# Patient Record
Sex: Male | Born: 1955 | Race: White | Hispanic: No | Marital: Single | Smoking: Light tobacco smoker
Health system: Southern US, Community
[De-identification: ages and names within clinical notes are randomized; demographics above are authoritative.]

## PROBLEM LIST (undated history)

## (undated) DIAGNOSIS — E119 Type 2 diabetes mellitus without complications: Secondary | ICD-10-CM

## (undated) DIAGNOSIS — I714 Abdominal aortic aneurysm, without rupture, unspecified: Secondary | ICD-10-CM

## (undated) DIAGNOSIS — Z8719 Personal history of other diseases of the digestive system: Secondary | ICD-10-CM

## (undated) DIAGNOSIS — K219 Gastro-esophageal reflux disease without esophagitis: Secondary | ICD-10-CM

## (undated) DIAGNOSIS — I639 Cerebral infarction, unspecified: Secondary | ICD-10-CM

## (undated) HISTORY — DX: Cerebral infarction, unspecified: I63.9

## (undated) HISTORY — DX: Abdominal aortic aneurysm, without rupture: I71.4

## (undated) HISTORY — PX: HERNIA REPAIR: SHX51

## (undated) HISTORY — PX: CHOLECYSTECTOMY: SHX55

## (undated) HISTORY — DX: Abdominal aortic aneurysm, without rupture, unspecified: I71.40

## (undated) HISTORY — PX: APPENDECTOMY: SHX54

---

## 2004-04-23 DIAGNOSIS — I639 Cerebral infarction, unspecified: Secondary | ICD-10-CM

## 2004-04-23 HISTORY — DX: Cerebral infarction, unspecified: I63.9

## 2013-08-09 ENCOUNTER — Emergency Department (HOSPITAL_COMMUNITY): Payer: BC Managed Care – PPO

## 2013-08-09 ENCOUNTER — Inpatient Hospital Stay (HOSPITAL_COMMUNITY)
Admission: EM | Admit: 2013-08-09 | Discharge: 2013-08-19 | DRG: 237 | Disposition: A | Discharge status: Home / Self Care | Payer: BC Managed Care – PPO | Attending: Surgery | Admitting: Surgery

## 2013-08-09 ENCOUNTER — Encounter (HOSPITAL_COMMUNITY): Payer: Self-pay | Admitting: Emergency Medicine

## 2013-08-09 DIAGNOSIS — J9600 Acute respiratory failure, unspecified whether with hypoxia or hypercapnia: Secondary | ICD-10-CM | POA: Diagnosis not present

## 2013-08-09 DIAGNOSIS — K5900 Constipation, unspecified: Secondary | ICD-10-CM | POA: Diagnosis present

## 2013-08-09 DIAGNOSIS — F10931 Alcohol use, unspecified with withdrawal delirium: Secondary | ICD-10-CM | POA: Diagnosis not present

## 2013-08-09 DIAGNOSIS — J189 Pneumonia, unspecified organism: Secondary | ICD-10-CM | POA: Diagnosis not present

## 2013-08-09 DIAGNOSIS — J9601 Acute respiratory failure with hypoxia: Secondary | ICD-10-CM

## 2013-08-09 DIAGNOSIS — F411 Generalized anxiety disorder: Secondary | ICD-10-CM | POA: Diagnosis present

## 2013-08-09 DIAGNOSIS — I7102 Dissection of abdominal aorta: Secondary | ICD-10-CM

## 2013-08-09 DIAGNOSIS — I4891 Unspecified atrial fibrillation: Secondary | ICD-10-CM | POA: Diagnosis not present

## 2013-08-09 DIAGNOSIS — E7800 Pure hypercholesterolemia, unspecified: Secondary | ICD-10-CM | POA: Diagnosis present

## 2013-08-09 DIAGNOSIS — E876 Hypokalemia: Secondary | ICD-10-CM | POA: Diagnosis not present

## 2013-08-09 DIAGNOSIS — R404 Transient alteration of awareness: Secondary | ICD-10-CM | POA: Diagnosis not present

## 2013-08-09 DIAGNOSIS — R4182 Altered mental status, unspecified: Secondary | ICD-10-CM | POA: Diagnosis not present

## 2013-08-09 DIAGNOSIS — F10239 Alcohol dependence with withdrawal, unspecified: Secondary | ICD-10-CM

## 2013-08-09 DIAGNOSIS — I10 Essential (primary) hypertension: Secondary | ICD-10-CM | POA: Diagnosis present

## 2013-08-09 DIAGNOSIS — Z888 Allergy status to other drugs, medicaments and biological substances status: Secondary | ICD-10-CM

## 2013-08-09 DIAGNOSIS — F10231 Alcohol dependence with withdrawal delirium: Secondary | ICD-10-CM | POA: Diagnosis not present

## 2013-08-09 DIAGNOSIS — T368X5A Adverse effect of other systemic antibiotics, initial encounter: Secondary | ICD-10-CM | POA: Diagnosis not present

## 2013-08-09 DIAGNOSIS — G9340 Encephalopathy, unspecified: Secondary | ICD-10-CM

## 2013-08-09 DIAGNOSIS — L270 Generalized skin eruption due to drugs and medicaments taken internally: Secondary | ICD-10-CM | POA: Diagnosis not present

## 2013-08-09 DIAGNOSIS — D649 Anemia, unspecified: Secondary | ICD-10-CM | POA: Diagnosis not present

## 2013-08-09 DIAGNOSIS — E119 Type 2 diabetes mellitus without complications: Secondary | ICD-10-CM | POA: Diagnosis present

## 2013-08-09 DIAGNOSIS — F1020 Alcohol dependence, uncomplicated: Secondary | ICD-10-CM | POA: Diagnosis present

## 2013-08-09 DIAGNOSIS — K219 Gastro-esophageal reflux disease without esophagitis: Secondary | ICD-10-CM | POA: Diagnosis present

## 2013-08-09 DIAGNOSIS — F17200 Nicotine dependence, unspecified, uncomplicated: Secondary | ICD-10-CM | POA: Diagnosis present

## 2013-08-09 DIAGNOSIS — E873 Alkalosis: Secondary | ICD-10-CM | POA: Diagnosis not present

## 2013-08-09 DIAGNOSIS — Z880 Allergy status to penicillin: Secondary | ICD-10-CM

## 2013-08-09 DIAGNOSIS — F10939 Alcohol use, unspecified with withdrawal, unspecified: Secondary | ICD-10-CM

## 2013-08-09 DIAGNOSIS — I714 Abdominal aortic aneurysm, without rupture, unspecified: Principal | ICD-10-CM | POA: Diagnosis present

## 2013-08-09 DIAGNOSIS — Z8673 Personal history of transient ischemic attack (TIA), and cerebral infarction without residual deficits: Secondary | ICD-10-CM

## 2013-08-09 DIAGNOSIS — I7100 Dissection of unspecified site of aorta: Secondary | ICD-10-CM

## 2013-08-09 DIAGNOSIS — T360X5A Adverse effect of penicillins, initial encounter: Secondary | ICD-10-CM | POA: Diagnosis not present

## 2013-08-09 DIAGNOSIS — T465X5A Adverse effect of other antihypertensive drugs, initial encounter: Secondary | ICD-10-CM | POA: Diagnosis not present

## 2013-08-09 DIAGNOSIS — I509 Heart failure, unspecified: Secondary | ICD-10-CM | POA: Diagnosis not present

## 2013-08-09 DIAGNOSIS — A419 Sepsis, unspecified organism: Secondary | ICD-10-CM | POA: Diagnosis not present

## 2013-08-09 HISTORY — DX: Personal history of other diseases of the digestive system: Z87.19

## 2013-08-09 HISTORY — DX: Gastro-esophageal reflux disease without esophagitis: K21.9

## 2013-08-09 HISTORY — DX: Type 2 diabetes mellitus without complications: E11.9

## 2013-08-09 LAB — CBC WITH DIFFERENTIAL/PLATELET
BASOS PCT: 0 % (ref 0–1)
Basophils Absolute: 0 10*3/uL (ref 0.0–0.1)
Eosinophils Absolute: 0 10*3/uL (ref 0.0–0.7)
Eosinophils Relative: 0 % (ref 0–5)
HEMATOCRIT: 42.6 % (ref 39.0–52.0)
Hemoglobin: 15.2 g/dL (ref 13.0–17.0)
LYMPHS PCT: 17 % (ref 12–46)
Lymphs Abs: 2.1 10*3/uL (ref 0.7–4.0)
MCH: 33.3 pg (ref 26.0–34.0)
MCHC: 35.7 g/dL (ref 30.0–36.0)
MCV: 93.2 fL (ref 78.0–100.0)
MONO ABS: 1.3 10*3/uL — AB (ref 0.1–1.0)
Monocytes Relative: 11 % (ref 3–12)
NEUTROS ABS: 8.6 10*3/uL — AB (ref 1.7–7.7)
NEUTROS PCT: 72 % (ref 43–77)
Platelets: 304 10*3/uL (ref 150–400)
RBC: 4.57 MIL/uL (ref 4.22–5.81)
RDW: 11.7 % (ref 11.5–15.5)
WBC: 12 10*3/uL — AB (ref 4.0–10.5)

## 2013-08-09 LAB — COMPREHENSIVE METABOLIC PANEL
ALBUMIN: 3.6 g/dL (ref 3.5–5.2)
ALK PHOS: 103 U/L (ref 39–117)
ALT: 84 U/L — ABNORMAL HIGH (ref 0–53)
AST: 44 U/L — AB (ref 0–37)
BUN: 14 mg/dL (ref 6–23)
CHLORIDE: 97 meq/L (ref 96–112)
CO2: 23 meq/L (ref 19–32)
Calcium: 9.5 mg/dL (ref 8.4–10.5)
Creatinine, Ser: 1 mg/dL (ref 0.50–1.35)
GFR calc Af Amer: >90 mL/min (ref >90)
GFR, EST NON AFRICAN AMERICAN: 83 mL/min — AB (ref >90)
Glucose, Bld: 93 mg/dL (ref 70–99)
POTASSIUM: 3.9 meq/L (ref 3.7–5.3)
SODIUM: 136 meq/L — AB (ref 137–147)
Total Bilirubin: 0.5 mg/dL (ref 0.3–1.2)
Total Protein: 8 g/dL (ref 6.0–8.3)

## 2013-08-09 LAB — URINALYSIS, ROUTINE W REFLEX MICROSCOPIC
Bilirubin Urine: NEGATIVE
GLUCOSE, UA: NEGATIVE mg/dL
Hgb urine dipstick: NEGATIVE
Ketones, ur: NEGATIVE mg/dL
Leukocytes, UA: NEGATIVE
NITRITE: NEGATIVE
Protein, ur: NEGATIVE mg/dL
Specific Gravity, Urine: >1.046 — ABNORMAL HIGH (ref 1.005–1.030)
Urobilinogen, UA: 0.2 mg/dL (ref 0.0–1.0)
pH: 5.5 (ref 5.0–8.0)

## 2013-08-09 LAB — TYPE AND SCREEN
ABO/RH(D): O POS
ANTIBODY SCREEN: NEGATIVE

## 2013-08-09 LAB — LIPASE, BLOOD: Lipase: 26 U/L (ref 11–59)

## 2013-08-09 MED ORDER — ONDANSETRON HCL 4 MG/2ML IJ SOLN
4.0000 mg | Freq: Four times a day (QID) | INTRAMUSCULAR | Status: DC | PRN
  Administered 2013-08-09: 4 mg via INTRAVENOUS
  Filled 2013-08-09 (×2): qty 2

## 2013-08-09 MED ORDER — IOHEXOL 300 MG/ML  SOLN
25.0000 mL | Freq: Once | INTRAMUSCULAR | Status: AC | PRN
  Administered 2013-08-09: 25 mL via ORAL

## 2013-08-09 MED ORDER — POTASSIUM CHLORIDE CRYS ER 20 MEQ PO TBCR
20.0000 meq | EXTENDED_RELEASE_TABLET | Freq: Once | ORAL | Status: AC
  Administered 2013-08-09: 40 meq via ORAL
  Filled 2013-08-09: qty 2

## 2013-08-09 MED ORDER — SODIUM CHLORIDE 0.9 % IV SOLN
1000.0000 mL | INTRAVENOUS | Status: DC
  Administered 2013-08-09: 1000 mL via INTRAVENOUS

## 2013-08-09 MED ORDER — MORPHINE SULFATE 2 MG/ML IJ SOLN
2.0000 mg | INTRAMUSCULAR | Status: DC | PRN
  Administered 2013-08-09 – 2013-08-10 (×2): 4 mg via INTRAVENOUS
  Filled 2013-08-09 (×2): qty 2

## 2013-08-09 MED ORDER — OXYCODONE HCL 5 MG PO TABS
5.0000 mg | ORAL_TABLET | ORAL | Status: DC | PRN
  Filled 2013-08-09 (×2): qty 2

## 2013-08-09 MED ORDER — HYDROMORPHONE HCL PF 1 MG/ML IJ SOLN
1.0000 mg | INTRAMUSCULAR | Status: AC | PRN
  Administered 2013-08-09 – 2013-08-10 (×8): 1 mg via INTRAVENOUS
  Filled 2013-08-09 (×9): qty 1

## 2013-08-09 MED ORDER — SODIUM CHLORIDE 0.9 % IV SOLN
1000.0000 mL | Freq: Once | INTRAVENOUS | Status: AC
  Administered 2013-08-09: 1000 mL via INTRAVENOUS

## 2013-08-09 MED ORDER — IOHEXOL 300 MG/ML  SOLN
100.0000 mL | Freq: Once | INTRAMUSCULAR | Status: AC | PRN
  Administered 2013-08-09: 100 mL via INTRAVENOUS

## 2013-08-09 MED ORDER — ONDANSETRON HCL 4 MG/2ML IJ SOLN
4.0000 mg | Freq: Once | INTRAMUSCULAR | Status: AC
  Administered 2013-08-09: 4 mg via INTRAVENOUS
  Filled 2013-08-09: qty 2

## 2013-08-09 MED ORDER — PANTOPRAZOLE SODIUM 40 MG PO TBEC
40.0000 mg | DELAYED_RELEASE_TABLET | Freq: Every day | ORAL | Status: DC
  Administered 2013-08-09: 40 mg via ORAL
  Filled 2013-08-09: qty 1

## 2013-08-09 MED ORDER — HYDRALAZINE HCL 20 MG/ML IJ SOLN
10.0000 mg | INTRAMUSCULAR | Status: DC | PRN
Start: 2013-08-09 — End: 2013-08-10
  Administered 2013-08-09 – 2013-08-10 (×2): 10 mg via INTRAVENOUS
  Filled 2013-08-09 (×2): qty 1

## 2013-08-09 MED ORDER — METOPROLOL TARTRATE 1 MG/ML IV SOLN
2.0000 mg | INTRAVENOUS | Status: AC | PRN
  Administered 2013-08-10: 2 mg via INTRAVENOUS
  Administered 2013-08-10: 3 mg via INTRAVENOUS
  Filled 2013-08-09: qty 5

## 2013-08-09 MED ORDER — LORAZEPAM 2 MG/ML IJ SOLN
0.5000 mg | Freq: Once | INTRAMUSCULAR | Status: AC
  Administered 2013-08-09: 0.5 mg via INTRAVENOUS
  Filled 2013-08-09: qty 1

## 2013-08-09 MED ORDER — SODIUM CHLORIDE 0.9 % IV SOLN
INTRAVENOUS | Status: DC
  Administered 2013-08-10: 07:00:00 via INTRAVENOUS

## 2013-08-09 MED ORDER — PHENOL 1.4 % MT LIQD
1.0000 | OROMUCOSAL | Status: DC | PRN
  Filled 2013-08-09: qty 177

## 2013-08-09 MED ORDER — HYDROMORPHONE HCL PF 1 MG/ML IJ SOLN
1.0000 mg | INTRAMUSCULAR | Status: DC | PRN
  Administered 2013-08-09 (×2): 1 mg via INTRAVENOUS
  Filled 2013-08-09 (×2): qty 1

## 2013-08-09 MED ORDER — LABETALOL HCL 5 MG/ML IV SOLN
10.0000 mg | INTRAVENOUS | Status: DC | PRN
  Administered 2013-08-10 (×2): 10 mg via INTRAVENOUS
  Filled 2013-08-09 (×2): qty 4

## 2013-08-09 NOTE — ED Notes (Signed)
Pt given water after talking to the vascular surgeon.  Surgery  Will be performed tuesday

## 2013-08-09 NOTE — H&P (Signed)
History and Physical  Patient name: Walter Morgan MRN: [id number] DOB: 06-17-1955 Sex: male   Reason for Admission:  Chief Complaint  Patient presents with  . Abdominal Pain  . Constipation    HISTORY OF PRESENT ILLNESS: 58 year old presented to the ER tonight with approximately 1 week of abdominal and back pain.  He had been seen bby his PCP and treated with muscle relaxers and narcotics without relief.  HE reports no aggravating or relieving factors.  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  Patient reports subjective fevers at home.  He has been constipated.  Stool softners and suppositories have not worked.   He is a smoker.  He has a history of a stroke about 10 years ago secondary to lifestyle and stress.  He has no residual deficits.  He takes a statin for hypercholesterolemia.  He has early diabetes but is not on any medications.  Past Medical History  Diagnosis Date  . GERD (gastroesophageal reflux disease)   . Diabetes     possibly early stage, not formally diagnosed- MD wanted to start on metformin- is not taking per patient     Past Surgical History  Procedure Laterality Date  . Appendectomy    . Cholecystectomy    . Hernia repair      History   Social History  . Marital Status: Single    Spouse Name: N/A    Number of Children: N/A  . Years of Education: N/A   Occupational History  . Not on file.   Social History Main Topics  . Smoking status: Current Every Day Smoker -- 1.00 packs/day    Types: Cigarettes  . Smokeless tobacco: Not on file  . Alcohol Use: Yes     Comment: social  . Drug Use: No  . Sexual Activity: Not on file   Other Topics Concern  . Not on file   Social History Narrative  . No narrative on file    Family History: HTN  Allergies as of 08/09/2013 - Review Complete 08/09/2013  Allergen Reaction Noted  . Antihistamines, chlorpheniramine-type Anaphylaxis 08/09/2013  . Benzonatate Other (See  Comments) 08/09/2013  . Other Itching 08/09/2013  . Penicillins Nausea And Vomiting 08/09/2013    No current facility-administered medications on file prior to encounter.   No current outpatient prescriptions on file prior to encounter.     REVIEW OF SYSTEMS: Cardiovascular: No chest pain, chest pressure, palpitations, orthopnea, or dyspnea on exertion. No claudication or rest pain,  No history of DVT or phlebitis. Pulmonary: No productive cough, asthma or wheezing. Neurologic: No weakness, paresthesias, aphasia, or amaurosis. No dizziness. Hematologic: No bleeding problems or clotting disorders. Musculoskeletal: No joint pain or joint swelling. Gastrointestinal: constipation, abdominal pain Genitourinary: No dysuria or hematuria. Psychiatric:: No history of major depression. Integumentary: No rashes or ulcers. Constitutional: No fever or chills.  PHYSICAL EXAMINATION: General: The patient appears their stated age.  Vital signs are BP 105/54  Pulse 105  Temp(Src) 98.8 F (37.1 C) (Oral)  Resp 18  SpO2 92% HEENT:  No gross abnormalities Pulmonary: Respirations are non-labored Abdomen: Soft.  Tender on mid abdomen down to pubis Musculoskeletal: There are no major deformities.   Neurologic: No focal weakness or paresthesias are detected, Skin: There are no ulcer or rashes noted. Psychiatric: anxiety. Cardiovascular: There is a regular rate and rhythm without significant murmur appreciated.  Palpable pedal pulses and femoral pulses  Diagnostic  Studies: I have reviewed his CT with radiology: 1. 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 This could reflect sentinel  bleed, aortitis, or small infected aneurysm although the aneurysm is  not particularly saccular. The aneurysm at this level  measures only  approximately 3.3 x 2.8 cm     Assessment:  Symptomatic infrarenal PAU Plan: The patient will be admitted for pain control and medical optimization, including blood pressure control.  I believe this is a PAU, which has been causing him symptoms for approximately 1 week.  He will likely need surgical repair which I think can be performed with a bifurcated stent graft.  I discussed the details and risks/benefits of the procedure with the patient.  I will likely do this on Tuesday.  There is a concern that this may be a mycotic process, and therefore I will obtain blood cultures.  He will get a CXR and EKG as well.  I will monitor him in the step down unit.  If he has progression of his symptoms, I will treat him earlier.     Jorge NyV. Wells Brabham IV, M.D. [facility] Office: [phone] Pager:  [phone]

## 2013-08-09 NOTE — ED Notes (Signed)
Patient transported to X-ray 

## 2013-08-09 NOTE — Progress Notes (Signed)
Pt. Has severe pain in theabdomen  Rradiates to the back, bowel sound faint very distended and taut, pain is worse than before. Was given earlier Morphine 4mg . And now dilaudid 1mg . IV  Pt.s tachycardic, BP elevated Dr. Myra GianottiBrabham made aware with orders made. Will cont. To monitor pt.

## 2013-08-09 NOTE — ED Provider Notes (Signed)
5:48 PM I discussed the patient's CT scan with our radiologists. I then discussed the findings with the patient and his companion. VS 150/80, 95 - sr, nml  Patient has no new complaints, but does have concern over the dissection.  I discussed his case with our vascular team.  ECG : sinus tach, 102, t wave abnormalities, abnormal  7:39 PM Patient has similar VS, but remains concerned about his abdomen.  Patient takes Xanax at home and was provided .5mg  Ativan due to anxiety.   Walter Munchobert Kali Ambler, MD 08/09/13 [phone]

## 2013-08-09 NOTE — ED Notes (Signed)
MD at bedside. 

## 2013-08-09 NOTE — ED Notes (Signed)
Report called to rn on 2900

## 2013-08-09 NOTE — ED Notes (Signed)
Patient transported to CT 

## 2013-08-09 NOTE — ED Provider Notes (Addendum)
CSN: [id number]     Arrival date & time 08/09/13  1247 History   First MD Initiated Contact with Patient 08/09/13 1326     Chief Complaint  Patient presents with  . Abdominal Pain  . Constipation    HPI Comments: It started about a week ago.  He saw his PCP and told him about the back troubles and some lower abdominal pain.  Pt had a UA performed at the office that was reportedly normal.  He was diagnosed with a lumbar strain.  The abdominal pain has gotten worse.  Pt has not had a BM since Monday morning.    Patient is a 58 y.o. male presenting with abdominal pain and constipation. The history is provided by the patient.  Abdominal Pain Pain location:  LLQ and RLQ Pain quality: stabbing   Pain radiates to:  Does not radiate Pain severity:  Severe Associated symptoms: anorexia and constipation   Associated symptoms: no cough, no diarrhea, no dysuria, no fever and no nausea   Constipation Associated symptoms: abdominal pain and anorexia   Associated symptoms: no diarrhea, no dysuria, no fever and no nausea   He has tried laxatives without relief.  He does drink alcohol.  Sometimes 3-4 drinks per night, other times none.  No recent increase.  Past Medical History  Diagnosis Date  . GERD (gastroesophageal reflux disease)   . Diabetes     possibly early stage, not formally diagnosed but started on metformin   Past Surgical History  Procedure Laterality Date  . Appendectomy    . Cholecystectomy    . Hernia repair     History reviewed. No pertinent family history. History  Substance Use Topics  . Smoking status: Current Every Day Smoker -- 1.00 packs/day    Types: Cigarettes  . Smokeless tobacco: Not on file  . Alcohol Use: Yes     Comment: social    Review of Systems  Constitutional: Negative for fever.  Respiratory: Negative for cough.   Gastrointestinal: Positive for abdominal pain, constipation and anorexia. Negative for nausea and diarrhea.  Genitourinary: Negative for  dysuria.  All other systems reviewed and are negative.     Allergies  Antihistamines, chlorpheniramine-type; Benzonatate; Other; and Penicillins  Home Medications   Prior to Admission medications   Not on File   BP 152/106  Pulse 120  Temp(Src) 98.8 F (37.1 C) (Oral)  Resp 20  SpO2 97% Physical Exam  Nursing note and vitals reviewed. Constitutional: He appears well-developed and well-nourished. No distress.  HENT:  Head: Normocephalic and atraumatic.  Right Ear: External ear normal.  Left Ear: External ear normal.  Eyes: Conjunctivae are normal. Right eye exhibits no discharge. Left eye exhibits no discharge. No scleral icterus.  Neck: Neck supple. No tracheal deviation present.  Cardiovascular: Normal rate, regular rhythm and intact distal pulses.   Pulmonary/Chest: Effort normal and breath sounds normal. No stridor. No respiratory distress. He has no wheezes. He has no rales.  Abdominal: Soft. Bowel sounds are normal. He exhibits no distension. There is tenderness in the epigastric area. There is no rebound and no guarding. No hernia.  Musculoskeletal: He exhibits no edema and no tenderness.  Neurological: He is alert. He has normal strength. He displays tremor. No cranial nerve deficit (no facial droop, extraocular movements intact, no slurred speech) or sensory deficit. He exhibits normal muscle tone. He displays no seizure activity. Coordination normal.  Skin: Skin is warm and dry. No rash noted.  Psychiatric: He has a  normal mood and affect.    ED Course  Procedures (including critical care time) Labs Review Labs Reviewed  CBC WITH DIFFERENTIAL - Abnormal; Notable for the following:    WBC 12.0 (*)    Neutro Abs 8.6 (*)    Monocytes Absolute 1.3 (*)    All other components within normal limits  COMPREHENSIVE METABOLIC PANEL - Abnormal; Notable for the following:    Sodium 136 (*)    AST 44 (*)    ALT 84 (*)    GFR calc non Af Amer 83 (*)    All other  components within normal limits  LIPASE, BLOOD  URINALYSIS, ROUTINE W REFLEX MICROSCOPIC    Imaging Review Dg Abd Acute W/chest  08/09/2013   CLINICAL DATA:  Lower right and left quadrant pain.  Constipation.  EXAM: ACUTE ABDOMEN SERIES (ABDOMEN 2 VIEW & CHEST 1 VIEW)  COMPARISON:  None.  FINDINGS: Frontal view of the chest demonstrates midline trachea. Normal heart size and mediastinal contours. No pleural effusion or pneumothorax. Lower lobe predominant interstitial thickening. Patchy bibasilar opacities, favored to represent areas of scarring. Greater on the right than left.  Abdominal films demonstrate no free intraperitoneal air on upright positioning. No significant air fluid levels. Large amount of ascending colonic stool. No small bowel dilatation. Probable prostatic calcifications project just above the symphysis pubis. Cholecystectomy. No abnormal abdominal calcifications. No appendicolith.  IMPRESSION: No acute findings.  Possible constipation.  Peribronchial thickening which may relate to chronic bronchitis or smoking. Probable scarring at the lung bases.   Electronically Signed   By: Jeronimo GreavesKyle  Talbot M.D.   On: 08/09/2013 15:34   Medications  0.9 %  sodium chloride infusion (1,000 mLs Intravenous New Bag/Given 08/09/13 1415)    Followed by  0.9 %  sodium chloride infusion (not administered)  HYDROmorphone (DILAUDID) injection 1 mg (1 mg Intravenous Given 08/09/13 1427)  ondansetron (ZOFRAN) injection 4 mg (4 mg Intravenous Given 08/09/13 1427)     MDM   Pt has elevated WBC with abdominal pain.  Lipase normal.  Only slight elevation in LFTs.  AAS with possible consitpation but Will CT abdomen to evaluate further.    Celene KrasJon R Aerin Delany, MD 08/09/13 [phone]  Will turn over case to Dr Arlyn DunningLockwood  Tonjua Rossetti R Jennings Corado, MD 08/09/13 [phone]

## 2013-08-09 NOTE — ED Notes (Signed)
MD at bedside. Vascular Surgeon.

## 2013-08-09 NOTE — ED Notes (Signed)
i called to give report.  That room is not ready yet.  rn will call me back

## 2013-08-09 NOTE — ED Notes (Signed)
Pt presents to department for evaluation of lower abdominal pain, abdominal distention, and lower back pain. 10/10 pain upon arrival. No nausea/vomiting. Denies urinary symptoms. Pt is alert and oriented x4.

## 2013-08-09 NOTE — Progress Notes (Signed)
MEDICATION RELATED NOTE   Pharmacy Consult for renal dosing of antibiotics  Labs:  Recent Labs  08/09/13 1416  WBC 12.0*  HGB 15.2  HCT 42.6  PLT 304  CREATININE 1.00  ALBUMIN 3.6  PROT 8.0  AST 44*  ALT 84*  ALKPHOS 103  BILITOT 0.5   Medications:  Anti-infectives   None     Assessment: 58yo admitted with c/o abdominal/back pain x 1 week.  A CT was done which reveals some retroperitoneal stranding surrounding a focal dissection and possible intramural hematoma, atelectasis, and a small hiatal hernia with some mild esophagitis.  He has some mild leukocytosis, but is currently afebrile.  He has no antibiotics ordered at this time.  We have been asked to dose adjust for renal function should he need antibiotic coverage.  His creatinine is 1.0 and he has an estimated crcl > 3250ml/min.  Plan:  1.  No adjustments for renal function needed at this time. 2.  Please re-consult if you would like us to dose his antibiotics should he require them.  Nadara MustardNita Kandiss Ihrig, PharmD., MS Clinical Pharmacist Pager:  [phone] Thank you for allowing pharmacy to be part of this patients care team. 08/09/2013,9:04 PM

## 2013-08-10 ENCOUNTER — Encounter (HOSPITAL_COMMUNITY): Payer: Self-pay | Admitting: *Deleted

## 2013-08-10 ENCOUNTER — Inpatient Hospital Stay (HOSPITAL_COMMUNITY): Payer: BC Managed Care – PPO

## 2013-08-10 LAB — CBC
HCT: 39.1 % (ref 39.0–52.0)
Hemoglobin: 13.7 g/dL (ref 13.0–17.0)
MCH: 32.6 pg (ref 26.0–34.0)
MCHC: 35 g/dL (ref 30.0–36.0)
MCV: 93.1 fL (ref 78.0–100.0)
Platelets: 297 10*3/uL (ref 150–400)
RBC: 4.2 MIL/uL — ABNORMAL LOW (ref 4.22–5.81)
RDW: 11.8 % (ref 11.5–15.5)
WBC: 11.5 10*3/uL — ABNORMAL HIGH (ref 4.0–10.5)

## 2013-08-10 LAB — COMPREHENSIVE METABOLIC PANEL
ALBUMIN: 3.3 g/dL — AB (ref 3.5–5.2)
ALK PHOS: 129 U/L — AB (ref 39–117)
ALT: 80 U/L — AB (ref 0–53)
AST: 39 U/L — ABNORMAL HIGH (ref 0–37)
BUN: 13 mg/dL (ref 6–23)
CO2: 20 mEq/L (ref 19–32)
Calcium: 9 mg/dL (ref 8.4–10.5)
Chloride: 101 mEq/L (ref 96–112)
Creatinine, Ser: 0.85 mg/dL (ref 0.50–1.35)
GFR calc Af Amer: >90 mL/min (ref >90)
GFR calc non Af Amer: >90 mL/min (ref >90)
Glucose, Bld: 105 mg/dL — ABNORMAL HIGH (ref 70–99)
Potassium: 4.4 mEq/L (ref 3.7–5.3)
SODIUM: 136 meq/L — AB (ref 137–147)
TOTAL PROTEIN: 7.4 g/dL (ref 6.0–8.3)
Total Bilirubin: 0.5 mg/dL (ref 0.3–1.2)

## 2013-08-10 LAB — PROTIME-INR
INR: 1.03 (ref 0.00–1.49)
Prothrombin Time: 13.3 seconds (ref 11.6–15.2)

## 2013-08-10 LAB — ABO/RH: ABO/RH(D): O POS

## 2013-08-10 LAB — MRSA PCR SCREENING: MRSA BY PCR: NEGATIVE

## 2013-08-10 MED ORDER — PANTOPRAZOLE SODIUM 20 MG PO TBEC
20.0000 mg | DELAYED_RELEASE_TABLET | Freq: Every day | ORAL | Status: DC
  Administered 2013-08-10: 20 mg via ORAL
  Filled 2013-08-10 (×2): qty 1

## 2013-08-10 MED ORDER — CYCLOBENZAPRINE HCL 10 MG PO TABS
10.0000 mg | ORAL_TABLET | Freq: Three times a day (TID) | ORAL | Status: DC | PRN
  Administered 2013-08-16 – 2013-08-18 (×5): 10 mg via ORAL
  Filled 2013-08-10 (×5): qty 1

## 2013-08-10 MED ORDER — PROMETHAZINE HCL 25 MG/ML IJ SOLN
25.0000 mg | Freq: Once | INTRAMUSCULAR | Status: AC
  Administered 2013-08-10: 25 mg via INTRAVENOUS
  Filled 2013-08-10: qty 1

## 2013-08-10 MED ORDER — PANTOPRAZOLE SODIUM 20 MG PO TBEC: 20.0000 mg | DELAYED_RELEASE_TABLET | Freq: Every day | ORAL | Status: DC

## 2013-08-10 MED ORDER — ONDANSETRON HCL 4 MG/2ML IJ SOLN
4.0000 mg | Freq: Four times a day (QID) | INTRAMUSCULAR | Status: DC | PRN
  Administered 2013-08-10 – 2013-08-16 (×4): 4 mg via INTRAVENOUS
  Filled 2013-08-10 (×3): qty 2

## 2013-08-10 MED ORDER — ADULT MULTIVITAMIN W/MINERALS CH
1.0000 | ORAL_TABLET | Freq: Every day | ORAL | Status: DC
  Administered 2013-08-10 – 2013-08-19 (×7): 1 via ORAL
  Filled 2013-08-10 (×11): qty 1

## 2013-08-10 MED ORDER — HYDRALAZINE HCL 20 MG/ML IJ SOLN
10.0000 mg | INTRAMUSCULAR | Status: DC | PRN
  Administered 2013-08-11 – 2013-08-16 (×2): 10 mg via INTRAVENOUS
  Filled 2013-08-10 (×2): qty 1

## 2013-08-10 MED ORDER — HYDROMORPHONE HCL PF 1 MG/ML IJ SOLN
1.0000 mg | INTRAMUSCULAR | Status: AC | PRN
  Administered 2013-08-10 – 2013-08-11 (×7): 1 mg via INTRAVENOUS
  Filled 2013-08-10 (×7): qty 1

## 2013-08-10 MED ORDER — ALIGN 4 MG PO CAPS: 4.0000 mg | ORAL_CAPSULE | Freq: Every day | ORAL | Status: DC

## 2013-08-10 MED ORDER — VANCOMYCIN HCL 10 G IV SOLR
1500.0000 mg | INTRAVENOUS | Status: AC
  Administered 2013-08-11: 1500 mg via INTRAVENOUS
  Filled 2013-08-10: qty 1500

## 2013-08-10 MED ORDER — IBUPROFEN 800 MG PO TABS
800.0000 mg | ORAL_TABLET | Freq: Four times a day (QID) | ORAL | Status: DC
  Administered 2013-08-10 (×3): 800 mg via ORAL
  Filled 2013-08-10 (×13): qty 1

## 2013-08-10 MED ORDER — ASPIRIN 81 MG PO TABS: 81.0000 mg | ORAL_TABLET | Freq: Every day | ORAL | Status: DC

## 2013-08-10 MED ORDER — PANTOPRAZOLE SODIUM 40 MG IV SOLR
40.0000 mg | INTRAVENOUS | Status: DC
  Administered 2013-08-10 – 2013-08-12 (×3): 40 mg via INTRAVENOUS
  Filled 2013-08-10 (×5): qty 40

## 2013-08-10 MED ORDER — ASPIRIN EC 81 MG PO TBEC
81.0000 mg | DELAYED_RELEASE_TABLET | Freq: Every day | ORAL | Status: DC
  Administered 2013-08-10: 81 mg via ORAL
  Filled 2013-08-10 (×4): qty 1

## 2013-08-10 MED ORDER — NITROPRUSSIDE SODIUM 25 MG/ML IV SOLN
0.2500 ug/kg/min | INTRAVENOUS | Status: DC
  Administered 2013-08-10: 0.25 ug/kg/min via INTRAVENOUS
  Administered 2013-08-10: 1 ug/kg/min via INTRAVENOUS
  Administered 2013-08-10 – 2013-08-11 (×3): 2 ug/kg/min via INTRAVENOUS
  Administered 2013-08-11: 0.5 ug/kg/min via INTRAVENOUS
  Filled 2013-08-10 (×7): qty 2

## 2013-08-10 MED ORDER — LORAZEPAM 1 MG PO TABS
1.0000 mg | ORAL_TABLET | Freq: Two times a day (BID) | ORAL | Status: DC | PRN
  Administered 2013-08-10 – 2013-08-11 (×4): 1 mg via ORAL
  Filled 2013-08-10 (×4): qty 1

## 2013-08-10 MED ORDER — IOHEXOL 350 MG/ML SOLN
100.0000 mL | Freq: Once | INTRAVENOUS | Status: AC | PRN
  Administered 2013-08-10: 100 mL via INTRAVENOUS

## 2013-08-10 MED ORDER — MULTI-VITAMIN/MINERALS PO TABS: 1.0000 | ORAL_TABLET | Freq: Every day | ORAL | Status: DC

## 2013-08-10 MED ORDER — FLUTICASONE PROPIONATE 50 MCG/ACT NA SUSP
1.0000 | Freq: Every day | NASAL | Status: DC
  Filled 2013-08-10 (×2): qty 16

## 2013-08-10 MED ORDER — PNEUMOCOCCAL VAC POLYVALENT 25 MCG/0.5ML IJ INJ: 0.5000 mL | INJECTION | INTRAMUSCULAR | Status: DC | PRN

## 2013-08-10 MED ORDER — LABETALOL HCL 5 MG/ML IV SOLN
10.0000 mg | INTRAVENOUS | Status: DC | PRN
  Administered 2013-08-10 – 2013-08-12 (×4): 10 mg via INTRAVENOUS
  Filled 2013-08-10 (×5): qty 4

## 2013-08-10 MED ORDER — ATORVASTATIN CALCIUM 10 MG PO TABS
10.0000 mg | ORAL_TABLET | Freq: Every day | ORAL | Status: DC
  Administered 2013-08-12 – 2013-08-18 (×7): 10 mg via ORAL
  Filled 2013-08-10 (×10): qty 1

## 2013-08-10 NOTE — Progress Notes (Signed)
Pt. Had svere pain again just had a dose of morphine 15-20 minutes ago. Abdomen remains taut and distended. Dr. Myra GianottiBrabham was notified of the latest CBC results and pts. Status for pain with order to give dilaudid as ordered and to keep SBP <140. Will cont. To monitor.

## 2013-08-10 NOTE — Progress Notes (Signed)
    Subjective  - HD #1  Significant abd and back  pain overnight   Physical Exam:  abd soft Extremities warm Respirations non-labored       Assessment/Plan:  HD#1  I am concerned that he has had such significant back pain overnight.  I am going to repeat his CT scan this am to see if there has been any changes.  He will be moved to the ICU for better blood pressure control.  If there have been changes on his CT scan, I will operate later today, if not, he is scheduled for tomorow  Nada LibmanVance W Wladyslaw Henrichs 08/10/2013 7:50 AM --  Filed Vitals:   08/10/13 0734  BP: 164/106  Pulse: 119  Temp: 100.5 F (38.1 C)  Resp: 17    Intake/Output Summary (Last 24 hours) at 08/10/13 0750 Last data filed at 08/10/13 0724  Gross per 24 hour  Intake    900 ml  Output    825 ml  Net     75 ml     Laboratory CBC    Component Value Date/Time   WBC 11.5* 08/10/2013 0032   HGB 13.7 08/10/2013 0032   HCT 39.1 08/10/2013 0032   PLT 297 08/10/2013 0032    BMET    Component Value Date/Time   NA 136* 08/10/2013 0032   K 4.4 08/10/2013 0032   CL 101 08/10/2013 0032   CO2 20 08/10/2013 0032   GLUCOSE 105* 08/10/2013 0032   BUN 13 08/10/2013 0032   CREATININE 0.85 08/10/2013 0032   CALCIUM 9.0 08/10/2013 0032   GFRNONAA >90 08/10/2013 0032   GFRAA >90 08/10/2013 0032    COAG Lab Results  Component Value Date   INR 1.03 08/10/2013   No results found for this basename: PTT    Antibiotics Anti-infectives   None       V. Charlena CrossWells Mada Sadik IV, M.D. [facility] Office: [phone] Pager:  [phone]

## 2013-08-10 NOTE — Care Management Note (Addendum)
    Page 1 of 1   08/19/2013     1:32:11 PM CARE MANAGEMENT NOTE 08/19/2013  Patient:  Walter Morgan, Walter Morgan   Account Number:  [id number]  Date Initiated:  08/10/2013  Documentation initiated by:  Elissa Hefty  Subjective/Objective Assessment:   adm w  dissection aorta abd     Action/Plan:   lives w fam, pcp dr Blenda Mounts   DC Planning Services  CM consult      [place]   Choice offered to / List presented to:  C-5 Sibling      HH arranged  [place].   Status of service:  Completed, signed off  Discharge Disposition:  [place]  Per UR Regulation:  Reviewed for med. necessity/level of care/duration of stay  Comments:  08/19/13 1321 [place] RN MSN BSN CCM Pt has decided he does need home therapy, has chosen [facility].  PT also recommends rolling walker but pt states he does not want walker, will obtain one later if needed.  08/17/13 1050 [place] MSN BSN CCM Met with pt and 2 sisters in room.  They report that pt lives alone and will need assistance after discharge. Sisters live in [place] but will stay with pt as long as needed.  One sister has reservations for a cruise but requests a letter stating she will be caregiver for pt s/p surgery so that her trip insurance will reimburse her reservation fee.  CM will compose letter for surgeon's signature.  PT recommends home health PT but pt wants to wait until he is closer to discharge to decide if he needs same.  Provided list of home health agencies.

## 2013-08-10 NOTE — Progress Notes (Signed)
Dr. Myra GianottiBrabham called in regards to patient's SBP and HR. Labetolol and Hydralize re-ordered PRN. Patient current SBP 121 with HR of 112, nipride drip at 2-mcg. Respiratory called due to patient O2 saturations. He is falling asleep and dropping into mid 80's. Venti masked placed at 6L. Patient doesn't expressed any discomfort with breathing. Currently resting comfortably. 2 Dilaudid administrations have been given for abd pain. Franki CabotBrianna Aleyssa Pike, RN

## 2013-08-11 ENCOUNTER — Inpatient Hospital Stay (HOSPITAL_COMMUNITY): Payer: BC Managed Care – PPO

## 2013-08-11 ENCOUNTER — Encounter (HOSPITAL_COMMUNITY): Payer: BC Managed Care – PPO | Admitting: Anesthesiology

## 2013-08-11 ENCOUNTER — Encounter (HOSPITAL_COMMUNITY)
Admission: EM | Disposition: A | Discharge status: Home / Self Care | Payer: Self-pay | Source: Home / Self Care | Attending: Surgery

## 2013-08-11 ENCOUNTER — Encounter (HOSPITAL_COMMUNITY): Payer: Self-pay | Admitting: Anesthesiology

## 2013-08-11 ENCOUNTER — Inpatient Hospital Stay (HOSPITAL_COMMUNITY): Payer: BC Managed Care – PPO | Admitting: Anesthesiology

## 2013-08-11 DIAGNOSIS — F10239 Alcohol dependence with withdrawal, unspecified: Secondary | ICD-10-CM

## 2013-08-11 DIAGNOSIS — I714 Abdominal aortic aneurysm, without rupture, unspecified: Secondary | ICD-10-CM | POA: Diagnosis present

## 2013-08-11 DIAGNOSIS — I7100 Dissection of unspecified site of aorta: Secondary | ICD-10-CM

## 2013-08-11 DIAGNOSIS — F10939 Alcohol use, unspecified with withdrawal, unspecified: Secondary | ICD-10-CM

## 2013-08-11 DIAGNOSIS — G9340 Encephalopathy, unspecified: Secondary | ICD-10-CM

## 2013-08-11 HISTORY — PX: ABDOMINAL AORTIC ENDOVASCULAR STENT GRAFT: SHX5707

## 2013-08-11 LAB — CBC
HCT: 32.4 % — ABNORMAL LOW (ref 39.0–52.0)
HEMATOCRIT: 35.4 % — AB (ref 39.0–52.0)
Hemoglobin: 11.9 g/dL — ABNORMAL LOW (ref 13.0–17.0)
Hemoglobin: 10.9 g/dL — ABNORMAL LOW (ref 13.0–17.0)
MCH: 31.9 pg (ref 26.0–34.0)
MCH: 31.7 pg (ref 26.0–34.0)
MCHC: 33.6 g/dL (ref 30.0–36.0)
MCHC: 33.6 g/dL (ref 30.0–36.0)
MCV: 94.9 fL (ref 78.0–100.0)
MCV: 94.2 fL (ref 78.0–100.0)
PLATELETS: 250 10*3/uL (ref 150–400)
Platelets: 268 10*3/uL (ref 150–400)
RBC: 3.73 MIL/uL — AB (ref 4.22–5.81)
RBC: 3.44 MIL/uL — AB (ref 4.22–5.81)
RDW: 11.9 % (ref 11.5–15.5)
RDW: 11.9 % (ref 11.5–15.5)
WBC: 10.6 10*3/uL — AB (ref 4.0–10.5)
WBC: 9.3 10*3/uL (ref 4.0–10.5)

## 2013-08-11 LAB — POCT I-STAT 3, ART BLOOD GAS (G3+)
ACID-BASE DEFICIT: 4 mmol/L — AB (ref 0.0–2.0)
BICARBONATE: 19.4 meq/L — AB (ref 20.0–24.0)
O2 Saturation: 89 %
PO2 ART: 54 mmHg — AB (ref 80.0–100.0)
TCO2: 20 mmol/L (ref 0–100)
pCO2 arterial: 29 mmHg — ABNORMAL LOW (ref 35.0–45.0)
pH, Arterial: 7.434 (ref 7.350–7.450)

## 2013-08-11 LAB — BASIC METABOLIC PANEL
BUN: 22 mg/dL (ref 6–23)
BUN: 18 mg/dL (ref 6–23)
CALCIUM: 8.6 mg/dL (ref 8.4–10.5)
CHLORIDE: 103 meq/L (ref 96–112)
CO2: 24 meq/L (ref 19–32)
CO2: 20 meq/L (ref 19–32)
Calcium: 9.1 mg/dL (ref 8.4–10.5)
Chloride: 101 mEq/L (ref 96–112)
Creatinine, Ser: 1 mg/dL (ref 0.50–1.35)
Creatinine, Ser: 0.92 mg/dL (ref 0.50–1.35)
GFR calc Af Amer: >90 mL/min (ref >90)
GFR calc Af Amer: >90 mL/min (ref >90)
GFR calc non Af Amer: 83 mL/min — ABNORMAL LOW (ref >90)
Glucose, Bld: 114 mg/dL — ABNORMAL HIGH (ref 70–99)
Glucose, Bld: 150 mg/dL — ABNORMAL HIGH (ref 70–99)
POTASSIUM: 4.5 meq/L (ref 3.7–5.3)
Potassium: 4.4 mEq/L (ref 3.7–5.3)
SODIUM: 138 meq/L (ref 137–147)
Sodium: 135 mEq/L — ABNORMAL LOW (ref 137–147)

## 2013-08-11 LAB — MAGNESIUM: Magnesium: 1.9 mg/dL (ref 1.5–2.5)

## 2013-08-11 LAB — CARBOXYHEMOGLOBIN
CARBOXYHEMOGLOBIN: 1.3 % (ref 0.5–1.5)
METHEMOGLOBIN: 1.1 % (ref 0.0–1.5)
O2 SAT: 31.5 %
Total hemoglobin: 10.2 g/dL — ABNORMAL LOW (ref 13.5–18.0)

## 2013-08-11 LAB — PROTIME-INR
INR: 1.02 (ref 0.00–1.49)
PROTHROMBIN TIME: 13.2 s (ref 11.6–15.2)

## 2013-08-11 LAB — APTT: aPTT: 32 seconds (ref 24–37)

## 2013-08-11 LAB — GLUCOSE, CAPILLARY: Glucose-Capillary: 117 mg/dL — ABNORMAL HIGH (ref 70–99)

## 2013-08-11 SURGERY — INSERTION, ENDOVASCULAR STENT GRAFT, AORTA, ABDOMINAL
Anesthesia: General

## 2013-08-11 MED ORDER — ESMOLOL HCL 10 MG/ML IV SOLN
INTRAVENOUS | Status: DC | PRN
  Administered 2013-08-11 (×2): 30 mg via INTRAVENOUS

## 2013-08-11 MED ORDER — FOLIC ACID 5 MG/ML IJ SOLN
1.0000 mg | Freq: Every day | INTRAMUSCULAR | Status: DC
  Administered 2013-08-11 – 2013-08-15 (×3): 1 mg via INTRAVENOUS
  Filled 2013-08-11 (×9): qty 0.2

## 2013-08-11 MED ORDER — FENTANYL CITRATE 0.05 MG/ML IJ SOLN
INTRAMUSCULAR | Status: DC | PRN
  Administered 2013-08-11 (×3): 50 ug via INTRAVENOUS
  Administered 2013-08-11: 100 ug via INTRAVENOUS
  Administered 2013-08-11: 50 ug via INTRAVENOUS
  Administered 2013-08-11 (×2): 100 ug via INTRAVENOUS

## 2013-08-11 MED ORDER — FENTANYL CITRATE 0.05 MG/ML IJ SOLN
INTRAMUSCULAR | Status: AC
  Filled 2013-08-11: qty 5

## 2013-08-11 MED ORDER — ESMOLOL HCL-SODIUM CHLORIDE 2000 MG/100ML IV SOLN
25.0000 ug/kg/min | INTRAVENOUS | Status: DC
  Administered 2013-08-12 (×3): 175 ug/kg/min via INTRAVENOUS
  Administered 2013-08-12: 125 ug/kg/min via INTRAVENOUS
  Administered 2013-08-12 (×3): 175 ug/kg/min via INTRAVENOUS
  Administered 2013-08-12: 25 ug/kg/min via INTRAVENOUS
  Administered 2013-08-12 (×2): 200 ug/kg/min via INTRAVENOUS
  Administered 2013-08-12 – 2013-08-13 (×3): 175 ug/kg/min via INTRAVENOUS
  Administered 2013-08-13: 150 ug/kg/min via INTRAVENOUS
  Administered 2013-08-13: 50 ug/kg/min via INTRAVENOUS
  Administered 2013-08-13: 90 ug/kg/min via INTRAVENOUS
  Administered 2013-08-13: 175 ug/kg/min via INTRAVENOUS
  Administered 2013-08-13: 170 ug/kg/min via INTRAVENOUS
  Administered 2013-08-13: 55 ug/kg/min via INTRAVENOUS
  Administered 2013-08-14: 100 ug/kg/min via INTRAVENOUS
  Administered 2013-08-14: 50 ug/kg/min via INTRAVENOUS
  Administered 2013-08-14: 100 ug/kg/min via INTRAVENOUS
  Filled 2013-08-11 (×27): qty 100

## 2013-08-11 MED ORDER — SODIUM CHLORIDE 0.9 % IJ SOLN
INTRAMUSCULAR | Status: AC
  Filled 2013-08-11: qty 10

## 2013-08-11 MED ORDER — VITAMIN B-1 100 MG PO TABS
100.0000 mg | ORAL_TABLET | Freq: Every day | ORAL | Status: DC
  Administered 2013-08-13 – 2013-08-19 (×6): 100 mg via ORAL
  Filled 2013-08-11 (×9): qty 1

## 2013-08-11 MED ORDER — PROPOFOL 10 MG/ML IV BOLUS
INTRAVENOUS | Status: AC
  Filled 2013-08-11: qty 20

## 2013-08-11 MED ORDER — POTASSIUM CHLORIDE CRYS ER 20 MEQ PO TBCR: 20.0000 meq | EXTENDED_RELEASE_TABLET | Freq: Every day | ORAL | Status: DC | PRN

## 2013-08-11 MED ORDER — HALOPERIDOL LACTATE 5 MG/ML IJ SOLN
5.0000 mg | Freq: Once | INTRAMUSCULAR | Status: AC
  Administered 2013-08-11: 5 mg via INTRAVENOUS

## 2013-08-11 MED ORDER — MAGNESIUM SULFATE 40 MG/ML IJ SOLN
2.0000 g | Freq: Every day | INTRAMUSCULAR | Status: DC | PRN
  Filled 2013-08-11: qty 50

## 2013-08-11 MED ORDER — LACTATED RINGERS IV SOLN
INTRAVENOUS | Status: DC | PRN
  Administered 2013-08-11: 13:00:00 via INTRAVENOUS

## 2013-08-11 MED ORDER — NEOSTIGMINE METHYLSULFATE 1 MG/ML IJ SOLN
INTRAMUSCULAR | Status: DC | PRN
  Administered 2013-08-11: 5 mg via INTRAVENOUS

## 2013-08-11 MED ORDER — HEPARIN SODIUM (PORCINE) 1000 UNIT/ML IJ SOLN
INTRAMUSCULAR | Status: AC
  Filled 2013-08-11: qty 1

## 2013-08-11 MED ORDER — VECURONIUM BROMIDE 10 MG IV SOLR
INTRAVENOUS | Status: AC
  Filled 2013-08-11: qty 10

## 2013-08-11 MED ORDER — 0.9 % SODIUM CHLORIDE (POUR BTL) OPTIME
TOPICAL | Status: DC | PRN
  Administered 2013-08-11: 1000 mL

## 2013-08-11 MED ORDER — PROPOFOL 10 MG/ML IV BOLUS
INTRAVENOUS | Status: DC | PRN
  Administered 2013-08-11: 100 mg via INTRAVENOUS

## 2013-08-11 MED ORDER — DEXMEDETOMIDINE BOLUS VIA INFUSION
1.0000 ug/kg | INTRAVENOUS | Status: DC
  Filled 2013-08-11: qty 93

## 2013-08-11 MED ORDER — OXYCODONE HCL 5 MG PO TABS: 5.0000 mg | ORAL_TABLET | Freq: Once | ORAL | Status: DC | PRN

## 2013-08-11 MED ORDER — PHENOL 1.4 % MT LIQD: 1.0000 | OROMUCOSAL | Status: DC | PRN

## 2013-08-11 MED ORDER — LORAZEPAM 2 MG/ML IJ SOLN
INTRAMUSCULAR | Status: AC
  Filled 2013-08-11: qty 1

## 2013-08-11 MED ORDER — NITROPRUSSIDE SODIUM 25 MG/ML IV SOLN
50000.0000 ug | INTRAVENOUS | Status: DC | PRN
  Administered 2013-08-11: .75 ug/kg/min via INTRAVENOUS

## 2013-08-11 MED ORDER — MIDAZOLAM HCL 2 MG/2ML IJ SOLN
INTRAMUSCULAR | Status: AC
  Filled 2013-08-11: qty 2

## 2013-08-11 MED ORDER — ACETAMINOPHEN 650 MG RE SUPP
325.0000 mg | RECTAL | Status: DC | PRN
  Administered 2013-08-12 – 2013-08-13 (×3): 650 mg via RECTAL
  Filled 2013-08-11 (×3): qty 1

## 2013-08-11 MED ORDER — HALOPERIDOL LACTATE 5 MG/ML IJ SOLN
INTRAMUSCULAR | Status: AC
  Filled 2013-08-11: qty 1

## 2013-08-11 MED ORDER — DEXMEDETOMIDINE HCL IN NACL 400 MCG/100ML IV SOLN
0.4000 ug/kg/h | INTRAVENOUS | Status: DC
  Administered 2013-08-11: 1.1 ug/kg/h via INTRAVENOUS
  Administered 2013-08-11: 1.2 ug/kg/h via INTRAVENOUS
  Filled 2013-08-11 (×2): qty 50

## 2013-08-11 MED ORDER — FOLIC ACID 1 MG PO TABS
1.0000 mg | ORAL_TABLET | Freq: Every day | ORAL | Status: DC
  Filled 2013-08-11: qty 1

## 2013-08-11 MED ORDER — ADULT MULTIVITAMIN W/MINERALS CH: 1.0000 | ORAL_TABLET | Freq: Every day | ORAL | Status: DC

## 2013-08-11 MED ORDER — HEPARIN SODIUM (PORCINE) 1000 UNIT/ML IJ SOLN
INTRAMUSCULAR | Status: DC | PRN
  Administered 2013-08-11: 10000 [IU] via INTRAVENOUS

## 2013-08-11 MED ORDER — VITAMIN B-1 100 MG PO TABS
100.0000 mg | ORAL_TABLET | Freq: Every day | ORAL | Status: DC
  Filled 2013-08-11: qty 1

## 2013-08-11 MED ORDER — HYDROMORPHONE HCL PF 1 MG/ML IJ SOLN
INTRAMUSCULAR | Status: AC
  Filled 2013-08-11: qty 1

## 2013-08-11 MED ORDER — LABETALOL HCL 5 MG/ML IV SOLN
INTRAVENOUS | Status: DC | PRN
  Administered 2013-08-11 (×4): 10 mg via INTRAVENOUS

## 2013-08-11 MED ORDER — ONDANSETRON HCL 4 MG/2ML IJ SOLN
INTRAMUSCULAR | Status: AC
  Filled 2013-08-11: qty 2

## 2013-08-11 MED ORDER — SODIUM CHLORIDE 0.9 % IV SOLN
INTRAVENOUS | Status: DC
  Administered 2013-08-12: 03:00:00 via INTRAVENOUS
  Administered 2013-08-12: 125 mL/h via INTRAVENOUS
  Administered 2013-08-13: 125 mL via INTRAVENOUS
  Administered 2013-08-15: 17:00:00 via INTRAVENOUS

## 2013-08-11 MED ORDER — SODIUM CHLORIDE 0.9 % IR SOLN
Status: DC | PRN
  Administered 2013-08-11: 14:00:00

## 2013-08-11 MED ORDER — OXYCODONE-ACETAMINOPHEN 5-325 MG PO TABS: 1.0000 | ORAL_TABLET | ORAL | Status: DC | PRN

## 2013-08-11 MED ORDER — ONDANSETRON HCL 4 MG/2ML IJ SOLN: 4.0000 mg | Freq: Once | INTRAMUSCULAR | Status: DC | PRN

## 2013-08-11 MED ORDER — HYDROMORPHONE HCL PF 1 MG/ML IJ SOLN: 1.0000 mg | INTRAMUSCULAR | Status: DC | PRN

## 2013-08-11 MED ORDER — LIDOCAINE HCL (CARDIAC) 20 MG/ML IV SOLN
INTRAVENOUS | Status: AC
  Filled 2013-08-11: qty 5

## 2013-08-11 MED ORDER — HYDRALAZINE HCL 20 MG/ML IJ SOLN
INTRAMUSCULAR | Status: AC
Start: 2013-08-11 — End: 2013-08-12
  Filled 2013-08-11: qty 1

## 2013-08-11 MED ORDER — VECURONIUM BROMIDE 10 MG IV SOLR
INTRAVENOUS | Status: DC | PRN
  Administered 2013-08-11: 2 mg via INTRAVENOUS
  Administered 2013-08-11: 1 mg via INTRAVENOUS

## 2013-08-11 MED ORDER — HALOPERIDOL LACTATE 5 MG/ML IJ SOLN
1.0000 mg | INTRAMUSCULAR | Status: DC | PRN
  Administered 2013-08-11: 4 mg via INTRAVENOUS
  Filled 2013-08-11: qty 1

## 2013-08-11 MED ORDER — OXYCODONE HCL 5 MG/5ML PO SOLN: 5.0000 mg | Freq: Once | ORAL | Status: DC | PRN

## 2013-08-11 MED ORDER — LORAZEPAM 2 MG/ML IJ SOLN: 1.0000 mg | Freq: Once | INTRAMUSCULAR | Status: DC

## 2013-08-11 MED ORDER — VANCOMYCIN HCL IN DEXTROSE 1-5 GM/200ML-% IV SOLN
1000.0000 mg | Freq: Two times a day (BID) | INTRAVENOUS | Status: AC
  Administered 2013-08-11 – 2013-08-12 (×2): 1000 mg via INTRAVENOUS
  Filled 2013-08-11 (×2): qty 200

## 2013-08-11 MED ORDER — MIDAZOLAM HCL 5 MG/5ML IJ SOLN
INTRAMUSCULAR | Status: DC | PRN
  Administered 2013-08-11 (×2): 1 mg via INTRAVENOUS

## 2013-08-11 MED ORDER — LACTATED RINGERS IV SOLN
INTRAVENOUS | Status: DC
  Administered 2013-08-11 (×2): via INTRAVENOUS

## 2013-08-11 MED ORDER — IODIXANOL 320 MG/ML IV SOLN
INTRAVENOUS | Status: DC | PRN
  Administered 2013-08-11: 59.06 mL via INTRA_ARTERIAL

## 2013-08-11 MED ORDER — METOPROLOL TARTRATE 1 MG/ML IV SOLN
INTRAVENOUS | Status: AC
  Filled 2013-08-11: qty 5

## 2013-08-11 MED ORDER — LORAZEPAM 2 MG/ML IJ SOLN
INTRAMUSCULAR | Status: AC
Start: 2013-08-11 — End: 2013-08-12
  Filled 2013-08-11: qty 1

## 2013-08-11 MED ORDER — FOLIC ACID 1 MG PO TABS
1.0000 mg | ORAL_TABLET | Freq: Every day | ORAL | Status: DC
  Administered 2013-08-13 – 2013-08-19 (×6): 1 mg via ORAL
  Filled 2013-08-11 (×9): qty 1

## 2013-08-11 MED ORDER — GLYCOPYRROLATE 0.2 MG/ML IJ SOLN
INTRAMUSCULAR | Status: DC | PRN
  Administered 2013-08-11: 0.4 mg via INTRAVENOUS

## 2013-08-11 MED ORDER — LORAZEPAM 2 MG/ML IJ SOLN
1.0000 mg | INTRAMUSCULAR | Status: DC | PRN
  Administered 2013-08-11 – 2013-08-12 (×4): 2 mg via INTRAVENOUS
  Filled 2013-08-11 (×4): qty 1

## 2013-08-11 MED ORDER — LEVOFLOXACIN IN D5W 500 MG/100ML IV SOLN
500.0000 mg | INTRAVENOUS | Status: AC
  Administered 2013-08-11: 500 mg via INTRAVENOUS
  Filled 2013-08-11: qty 100

## 2013-08-11 MED ORDER — DEXMEDETOMIDINE HCL IN NACL 400 MCG/100ML IV SOLN
0.2000 ug/kg/h | INTRAVENOUS | Status: DC
  Administered 2013-08-11: 1.7 ug/kg/h via INTRAVENOUS
  Administered 2013-08-11: 1.2 ug/kg/h via INTRAVENOUS
  Administered 2013-08-11 – 2013-08-12 (×2): 2 ug/kg/h via INTRAVENOUS
  Administered 2013-08-12: 1.3 ug/kg/h via INTRAVENOUS
  Administered 2013-08-12: 2 ug/kg/h via INTRAVENOUS
  Administered 2013-08-12: 1.5 ug/kg/h via INTRAVENOUS
  Administered 2013-08-12: 1.4 ug/kg/h via INTRAVENOUS
  Filled 2013-08-11 (×5): qty 100
  Filled 2013-08-11: qty 50
  Filled 2013-08-11: qty 100
  Filled 2013-08-11: qty 50

## 2013-08-11 MED ORDER — LORAZEPAM 2 MG/ML IJ SOLN: 1.0000 mg | INTRAMUSCULAR | Status: DC | PRN

## 2013-08-11 MED ORDER — MEPERIDINE HCL 25 MG/ML IJ SOLN
6.2500 mg | INTRAMUSCULAR | Status: DC | PRN
Start: 2013-08-11 — End: 2013-08-11

## 2013-08-11 MED ORDER — LORAZEPAM 2 MG/ML IJ SOLN
INTRAMUSCULAR | Status: AC
  Administered 2013-08-11: 2 mg via INTRAVENOUS
  Filled 2013-08-11: qty 1

## 2013-08-11 MED ORDER — ROCURONIUM BROMIDE 50 MG/5ML IV SOLN
INTRAVENOUS | Status: AC
  Filled 2013-08-11: qty 1

## 2013-08-11 MED ORDER — NEOSTIGMINE METHYLSULFATE 1 MG/ML IJ SOLN
INTRAMUSCULAR | Status: AC
  Filled 2013-08-11: qty 10

## 2013-08-11 MED ORDER — HYDROMORPHONE HCL PF 1 MG/ML IJ SOLN
0.2500 mg | INTRAMUSCULAR | Status: DC | PRN
Start: 2013-08-11 — End: 2013-08-11
  Administered 2013-08-11: 1 mg via INTRAVENOUS

## 2013-08-11 MED ORDER — DOCUSATE SODIUM 100 MG PO CAPS: 100.0000 mg | ORAL_CAPSULE | Freq: Every day | ORAL | Status: DC

## 2013-08-11 MED ORDER — ROCURONIUM BROMIDE 100 MG/10ML IV SOLN
INTRAVENOUS | Status: DC | PRN
  Administered 2013-08-11: 50 mg via INTRAVENOUS

## 2013-08-11 MED ORDER — ACETAMINOPHEN 325 MG PO TABS: 325.0000 mg | ORAL_TABLET | ORAL | Status: DC | PRN

## 2013-08-11 MED ORDER — LORAZEPAM 2 MG/ML IJ SOLN
1.0000 mg | INTRAMUSCULAR | Status: DC | PRN
  Administered 2013-08-11 (×2): 1 mg via INTRAVENOUS
  Filled 2013-08-11: qty 1

## 2013-08-11 MED ORDER — GLYCOPYRROLATE 0.2 MG/ML IJ SOLN
INTRAMUSCULAR | Status: AC
  Filled 2013-08-11: qty 2

## 2013-08-11 MED ORDER — MIDAZOLAM HCL 2 MG/2ML IJ SOLN
1.0000 mg | INTRAMUSCULAR | Status: AC
  Administered 2013-08-11: 1 mg via INTRAVENOUS

## 2013-08-11 MED ORDER — SODIUM CHLORIDE 0.9 % IV SOLN: 500.0000 mL | Freq: Once | INTRAVENOUS | Status: AC | PRN

## 2013-08-11 MED ORDER — THIAMINE HCL 100 MG/ML IJ SOLN
100.0000 mg | Freq: Every day | INTRAMUSCULAR | Status: DC
  Administered 2013-08-11 – 2013-08-15 (×3): 100 mg via INTRAVENOUS
  Filled 2013-08-11 (×9): qty 1

## 2013-08-11 MED ORDER — ARTIFICIAL TEARS OP OINT
TOPICAL_OINTMENT | OPHTHALMIC | Status: AC
  Filled 2013-08-11: qty 3.5

## 2013-08-11 MED ORDER — LIDOCAINE HCL (CARDIAC) 20 MG/ML IV SOLN
INTRAVENOUS | Status: DC | PRN
  Administered 2013-08-11: 100 mg via INTRAVENOUS

## 2013-08-11 MED ORDER — ENOXAPARIN SODIUM 40 MG/0.4ML ~~LOC~~ SOLN
40.0000 mg | SUBCUTANEOUS | Status: DC
  Filled 2013-08-11: qty 0.4

## 2013-08-11 MED ORDER — PROTAMINE SULFATE 10 MG/ML IV SOLN
INTRAVENOUS | Status: DC | PRN
  Administered 2013-08-11: 50 mg via INTRAVENOUS

## 2013-08-11 MED ORDER — LABETALOL HCL 5 MG/ML IV SOLN
INTRAVENOUS | Status: AC
  Administered 2013-08-11: 10 mg
  Filled 2013-08-11: qty 4

## 2013-08-11 MED ORDER — HYDROMORPHONE HCL PF 1 MG/ML IJ SOLN
INTRAMUSCULAR | Status: AC
  Administered 2013-08-11: 1 mg
  Filled 2013-08-11: qty 1

## 2013-08-11 MED ORDER — NITROPRUSSIDE SODIUM 25 MG/ML IV SOLN
0.2500 ug/kg/min | INTRAVENOUS | Status: DC
  Administered 2013-08-11: 2 ug/kg/min via INTRAVENOUS
  Filled 2013-08-11: qty 4

## 2013-08-11 MED ORDER — ARTIFICIAL TEARS OP OINT
TOPICAL_OINTMENT | OPHTHALMIC | Status: DC | PRN
  Administered 2013-08-11: 1 via OPHTHALMIC

## 2013-08-11 SURGICAL SUPPLY — 75 items
BAG BANDED W/RUBBER/TAPE 36X54 (MISCELLANEOUS) ×3 IMPLANT
BAG SNAP BAND KOVER 36X36 (MISCELLANEOUS) ×9 IMPLANT
BLADE SURG CLIPPER 3M 9600 (MISCELLANEOUS) ×3 IMPLANT
CANISTER SUCTION 2500CC (MISCELLANEOUS) ×3 IMPLANT
CATH BEACON 5.038 65CM KMP-01 (CATHETERS) ×6 IMPLANT
CATH OMNI FLUSH .035X70CM (CATHETERS) ×3 IMPLANT
CLIP TI MEDIUM 24 (CLIP) IMPLANT
CLIP TI WIDE RED SMALL 24 (CLIP) IMPLANT
COVER DOME SNAP 22 D (MISCELLANEOUS) ×3 IMPLANT
COVER MAYO STAND STRL (DRAPES) ×3 IMPLANT
COVER PROBE W GEL 5X96 (DRAPES) ×3 IMPLANT
COVER SURGICAL LIGHT HANDLE (MISCELLANEOUS) ×3 IMPLANT
DERMABOND ADVANCED (GAUZE/BANDAGES/DRESSINGS) ×4
DERMABOND ADVANCED .7 DNX12 (GAUZE/BANDAGES/DRESSINGS) ×2 IMPLANT
DEVICE CLOSURE PERCLS PRGLD 6F (VASCULAR PRODUCTS) ×5 IMPLANT
DRAPE TABLE COVER HEAVY DUTY (DRAPES) ×3 IMPLANT
DRESSING OPSITE X SMALL 2X3 (GAUZE/BANDAGES/DRESSINGS) ×6 IMPLANT
DRYSEAL FLEXSHEATH 12FR 33CM (SHEATH) ×2
DRYSEAL FLEXSHEATH 16FR 33CM (SHEATH) ×2
ELECT REM PT RETURN 9FT ADLT (ELECTROSURGICAL) ×6
ELECTRODE REM PT RTRN 9FT ADLT (ELECTROSURGICAL) ×2 IMPLANT
EXCLUDER TNK LEG 26MX12X14 (Endovascular Graft) ×1 IMPLANT
EXCLUDER TRUNK LEG 26MX12X14 (Endovascular Graft) ×3 IMPLANT
GAUZE SPONGE 2X2 8PLY STRL LF (GAUZE/BANDAGES/DRESSINGS) ×2 IMPLANT
GLOVE BIOGEL PI IND STRL 6.5 (GLOVE) ×2 IMPLANT
GLOVE BIOGEL PI IND STRL 7.5 (GLOVE) ×1 IMPLANT
GLOVE BIOGEL PI IND STRL 8 (GLOVE) ×2 IMPLANT
GLOVE BIOGEL PI INDICATOR 6.5 (GLOVE) ×4
GLOVE BIOGEL PI INDICATOR 7.5 (GLOVE) ×2
GLOVE BIOGEL PI INDICATOR 8 (GLOVE) ×4
GLOVE ECLIPSE 7.5 STRL STRAW (GLOVE) ×3 IMPLANT
GLOVE SURG SS PI 7.0 STRL IVOR (GLOVE) ×3 IMPLANT
GLOVE SURG SS PI 7.5 STRL IVOR (GLOVE) ×3 IMPLANT
GOWN STRL REUS W/ TWL LRG LVL3 (GOWN DISPOSABLE) ×2 IMPLANT
GOWN STRL REUS W/ TWL XL LVL3 (GOWN DISPOSABLE) ×1 IMPLANT
GOWN STRL REUS W/TWL LRG LVL3 (GOWN DISPOSABLE) ×4
GOWN STRL REUS W/TWL XL LVL3 (GOWN DISPOSABLE) ×2
GRAFT BALLN CATH 65CM (STENTS) ×1 IMPLANT
GRAFT EXCLUDER LEG 12X10 (Endovascular Graft) ×3 IMPLANT
HEMOSTAT SNOW SURGICEL 2X4 (HEMOSTASIS) IMPLANT
KIT BASIN OR (CUSTOM PROCEDURE TRAY) ×3 IMPLANT
KIT ROOM TURNOVER OR (KITS) ×3 IMPLANT
NEEDLE PERC 18GX7CM (NEEDLE) ×3 IMPLANT
NS IRRIG 1000ML POUR BTL (IV SOLUTION) ×3 IMPLANT
PACK AORTA (CUSTOM PROCEDURE TRAY) ×3 IMPLANT
PAD ARMBOARD 7.5X6 YLW CONV (MISCELLANEOUS) ×6 IMPLANT
PENCIL BUTTON HOLSTER BLD 10FT (ELECTRODE) ×3 IMPLANT
PERCLOSE PROGLIDE 6F (VASCULAR PRODUCTS) ×15
PROTECTION STATION PRESSURIZED (MISCELLANEOUS) ×3
SHEATH AVANTI 11CM 8FR (MISCELLANEOUS) ×3 IMPLANT
SHEATH BRITE TIP 8FR 23CM (MISCELLANEOUS) ×3 IMPLANT
SHEATH DRYSEAL FLEX 12FR 33CM (SHEATH) ×1 IMPLANT
SHEATH DRYSEAL FLEX 16FR 33CM (SHEATH) ×1 IMPLANT
SPONGE GAUZE 2X2 STER 10/PKG (GAUZE/BANDAGES/DRESSINGS) ×4
STATION PROTECTION PRESSURIZED (MISCELLANEOUS) ×1 IMPLANT
STENT GRAFT BALLN CATH 65CM (STENTS) ×2
STOPCOCK MORSE 400PSI 3WAY (MISCELLANEOUS) ×3 IMPLANT
SUT ETHILON 3 0 PS 1 (SUTURE) IMPLANT
SUT PROLENE 5 0 C 1 24 (SUTURE) IMPLANT
SUT VIC AB 2-0 CT1 27 (SUTURE)
SUT VIC AB 2-0 CT1 TAPERPNT 27 (SUTURE) IMPLANT
SUT VIC AB 3-0 SH 27 (SUTURE)
SUT VIC AB 3-0 SH 27X BRD (SUTURE) IMPLANT
SUT VICRYL 4-0 PS2 18IN ABS (SUTURE) ×6 IMPLANT
SYR 20CC LL (SYRINGE) ×6 IMPLANT
SYR 30ML LL (SYRINGE) IMPLANT
SYR 5ML LL (SYRINGE) IMPLANT
SYR MEDRAD MARK V 150ML (SYRINGE) ×3 IMPLANT
SYRINGE 10CC LL (SYRINGE) ×9 IMPLANT
TOWEL OR 17X24 6PK STRL BLUE (TOWEL DISPOSABLE) ×6 IMPLANT
TOWEL OR 17X26 10 PK STRL BLUE (TOWEL DISPOSABLE) ×6 IMPLANT
TRAY FOLEY CATH 16FRSI W/METER (SET/KITS/TRAYS/PACK) ×3 IMPLANT
TUBING HIGH PRESSURE 120CM (CONNECTOR) ×3 IMPLANT
WIRE AMPLATZ SS-J .035X180CM (WIRE) ×6 IMPLANT
WIRE BENTSON .035X145CM (WIRE) ×6 IMPLANT

## 2013-08-11 NOTE — H&P (Signed)
History and Physical  Patient name: Walter Morgan MRN: [id number] DOB: 06/25/1955 Sex: male  Reason for Admission:  Chief Complaint   Patient presents with   .  Abdominal Pain   .  Constipation    HISTORY OF PRESENT ILLNESS:  58 year old presented to the ER tonight with approximately 1 week of abdominal and back pain. He had been seen bby his PCP and treated with muscle relaxers and narcotics without relief. HE reports no aggravating or relieving factors. 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 Patient reports subjective fevers at home. He has been constipated. Stool softners and suppositories have not worked. He is a smoker. He has a history of a stroke about 10 years ago secondary to lifestyle and stress. He has no residual deficits. He takes a statin for hypercholesterolemia. He has early diabetes but is not on any medications.  Past Medical History   Diagnosis  Date   .  GERD (gastroesophageal reflux disease)    .  Diabetes      possibly early stage, not formally diagnosed- MD wanted to start on metformin- is not taking per patient    Past Surgical History   Procedure  Laterality  Date   .  Appendectomy     .  Cholecystectomy     .  Hernia repair      History    Social History   .  Marital Status:  Single     Spouse Name:  N/A     Number of Children:  N/A   .  Years of Education:  N/A    Occupational History   .  Not on file.    Social History Main Topics   .  Smoking status:  Current Every Day Smoker -- 1.00 packs/day     Types:  Cigarettes   .  Smokeless tobacco:  Not on file   .  Alcohol Use:  Yes      Comment: social   .  Drug Use:  No   .  Sexual Activity:  Not on file    Other Topics  Concern   .  Not on file    Social History Narrative   .  No narrative on file    Family History:  HTN  Allergies as of 08/09/2013 - Review Complete 08/09/2013   Allergen  Reaction  Noted   .  Antihistamines, chlorpheniramine-type  Anaphylaxis   08/09/2013   .  Benzonatate  Other (See Comments)  08/09/2013   .  Other  Itching  08/09/2013   .  Penicillins  Nausea And Vomiting  08/09/2013    No current facility-administered medications on file prior to encounter.    No current outpatient prescriptions on file prior to encounter.    REVIEW OF SYSTEMS:  Cardiovascular: No chest pain, chest pressure, palpitations, orthopnea, or dyspnea on exertion. No claudication or rest pain, No history of DVT or phlebitis.  Pulmonary: No productive cough, asthma or wheezing.  Neurologic: No weakness, paresthesias, aphasia, or amaurosis. No dizziness.  Hematologic: No bleeding problems or clotting disorders.  Musculoskeletal: No joint pain or joint swelling.  Gastrointestinal: constipation, abdominal pain  Genitourinary: No dysuria or hematuria.  Psychiatric:: No history of major depression.  Integumentary: No rashes or ulcers.  Constitutional: No fever or chills.  PHYSICAL EXAMINATION:  General: The patient appears their stated age. Vital signs are BP 105/54  Pulse 105  Temp(Src) 98.8 F (37.1 C) (Oral)  Resp  18  SpO2 92%  HEENT: No gross abnormalities  Pulmonary: Respirations are non-labored  Abdomen: Soft. Tender on mid abdomen down to pubis  Musculoskeletal: There are no major deformities.  Neurologic: No focal weakness or paresthesias are detected,  Skin: There are no ulcer or rashes noted.  Psychiatric: anxiety.  Cardiovascular: There is a regular rate and rhythm without significant murmur appreciated. Palpable pedal pulses and femoral pulses  Diagnostic Studies:  I have reviewed his CT with radiology:  1. 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 This could reflect sentinel  bleed, aortitis, or small infected aneurysm although the aneurysm  is  not particularly saccular. The aneurysm at this level measures only  approximately 3.3 x 2.8 cm  Assessment:  Symptomatic infrarenal PAU  Plan:  The patient will be admitted for pain control and medical optimization, including blood pressure control. I believe this is a PAU, which has been causing him symptoms for approximately 1 week. He will likely need surgical repair which I think can be performed with a bifurcated stent graft. I discussed the details and risks/benefits of the procedure with the patient. I will likely do this on Tuesday. There is a concern that this may be a mycotic process, and therefore I will obtain blood cultures. He will get a CXR and EKG as well. I will monitor him in the step down unit. If he has progression of his symptoms, I will treat him earlier.   The patient has had persistent abdominal pain.  Repeat CT angiogram yesterday showed no significant change.  He comes today for endovascular repair.  I did discuss all of the details including the risks and benefits of the operation with the patient.  Blood cultures have been negative to date.    Jorge NyV. Wells Miya Luviano IV, M.D.  [facility]  Office: [phone]  Pager: [phone]

## 2013-08-11 NOTE — Progress Notes (Signed)
Behavior on arrival continues/ drs brabham and Ossey at bedside. Staff still physically intervening to keep pt safe. Oriented only to self. Flight of ideas from talking of ' contagious stuff" to horses and " fucking let me go. i am trying to save all your jobs".

## 2013-08-11 NOTE — Consult Note (Signed)
PULMONARY / CRITICAL CARE MEDICINE   Name: Walter Morgan Feltes MRN: [id number] DOB: 08-Jul-1955    ADMISSION DATE:  08/09/2013 CONSULTATION DATE:  08/11/2013  REFERRING MD :  Myra GianottiBrabham PRIMARY SERVICE: Vasc Surgery  CHIEF COMPLAINT:  Altered mental status s/p AAA repair  BRIEF PATIENT DESCRIPTION: 58 yo male status post AAA repair 4/21. He is being managed in ICU for BP tight BP control. He has been agitated and combative with staff. PCCM asked to see. Of note his family reports that he has 2-3 alcoholic "drinks" per day.    SIGNIFICANT EVENTS / STUDIES:  4/21 CT abodmoen >>> Abnormal retroperitoneal stranding surrounding a focal dissection and possible intramural hematoma along a minimally aneurysmal segment of the infrarenal abdominal aorta. 3.3 x 2.8cm. 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 His partner reports that he has been a daily drinker for some time, and has had experiences like this before. Also states, however that he may not have had a drink for 2 weeks. He presented to the ER 4/19 with approximately 1 week of abdominal and back pain. He had been seen bby his PCP and treated with muscle relaxers and narcotics without relief. He reported no aggravating or relieving factors. 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 He had surgical repair of the aneurysm 4/21. Postoperatively he was combative with staff and disoriented. He was started on precedex infusion with some improvement. 4/21 PM he remains agitated and screaming at staff, trying to climb out of bed. PCCM asked to see for help managing delirium.    PAST MEDICAL HISTORY :  Past Medical History  Diagnosis Date  . GERD (gastroesophageal reflux disease)   . Diabetes     possibly  early stage, not formally diagnosed- MD wanted to start on metformin- is not taking per patient   . H/O hiatal hernia     repaired   Past Surgical History  Procedure Laterality Date  . Appendectomy    . Cholecystectomy    . Hernia repair     Prior to Admission medications   Medication Sig Start Date End Date Taking? Authorizing Provider  aspirin 81 MG tablet Take 81 mg by mouth daily.   Yes Historical Provider, MD  Coenzyme Q10 (CO Q 10 PO) Take 1 capsule by mouth daily.   Yes Historical Provider, MD  CRESTOR 5 MG tablet Take 5 mg by mouth daily.  07/26/13  Yes Historical Provider, MD  cyclobenzaprine (FLEXERIL) 10 MG tablet Take 10 mg by mouth 3 (three) times daily as needed for muscle spasms.  08/04/13  Yes Historical Provider, MD  ibuprofen (ADVIL,MOTRIN) 800 MG tablet Take 800 mg by mouth 4 (four) times daily.  08/04/13  Yes Historical Provider, MD  lansoprazole (PREVACID) 30 MG capsule Take 30 mg by mouth daily.  07/26/13  Yes Historical Provider, MD  LORazepam (ATIVAN) 1 MG tablet Take 1 mg by mouth 2 (two) times daily as needed.  07/26/13  Yes Historical Provider, MD  mometasone (NASONEX) 50 MCG/ACT nasal spray Place 2 sprays into the nose daily as needed (allergies).   Yes Historical Provider, MD  Multiple Vitamins-Minerals (MULTIVITAMIN WITH MINERALS) tablet Take 1 tablet by mouth daily.   Yes Historical Provider, MD  Probiotic Product (  ALIGN) 4 MG CAPS Take 4 mg by mouth daily.   Yes Historical Provider, MD  traMADol (ULTRAM) 50 MG tablet Take 50 mg by mouth every 4 (four) hours.  08/04/13  Yes Historical Provider, MD   Allergies  Allergen Reactions  . Antihistamines, Chlorpheniramine-Type Anaphylaxis    All anithistamines   . Benzonatate Other (See Comments)    tounge swelling   . Other Itching    Darvocet  . Penicillins Nausea And Vomiting    FAMILY HISTORY:  History reviewed. No pertinent family history. SOCIAL HISTORY:  reports that he has been smoking Cigarettes.  He has  been smoking about 1.00 pack per day. He does not have any smokeless tobacco history on file. He reports that he drinks about 1.2 ounces of alcohol per week. He reports that he does not use illicit drugs.  REVIEW OF SYSTEMS:  Unable due to altered mental status  SUBJECTIVE:   VITAL SIGNS: Temp:  [97.6 F (36.4 C)-99.1 F (37.3 C)] 98.8 F (37.1 C) (04/21 1944) Pulse Rate:  [91-136] 106 (04/21 2030) Resp:  [12-34] 23 (04/21 2030) BP: (104-180)/(56-88) 126/61 mmHg (04/21 2030) SpO2:  [85 %-99 %] 94 % (04/21 2030) Arterial Line BP: (72-188)/(44-177) 79/68 mmHg (04/21 2030) HEMODYNAMICS:   VENTILATOR SETTINGS:   INTAKE / OUTPUT: Intake/Output     04/21 0701 - 04/22 0700   I.V. (mL/kg) 2806.3 (25.2)   IV Piggyback 200   Total Intake(mL/kg) 3006.3 (27)   Urine (mL/kg/hr) 1575 (1)   Blood    Total Output 1575   Net +1431.3         PHYSICAL EXAMINATION: General:  Overweight male (245 lbs) anxious and combative in bed.  Neuro:  Alert, disoriented. Does not follow commands. Screaming phrases that do not make sense HEENT:  [place]/AT, PERRL Cardiovascular:  Tachy Lungs:  Clear anteriorly Abdomen:  Soft, non-distended Musculoskeletal:  No acute deformity/ROM limitations Skin:  Intact  LABS:  CBC  Recent Labs Lab 08/10/13 0032 08/11/13 0258 08/11/13 1640  WBC 11.5* 10.6* 9.3  HGB 13.7 11.9* 10.9*  HCT 39.1 35.4* 32.4*  PLT 297 268 250   Coag's  Recent Labs Lab 08/10/13 0032 08/11/13 1640  APTT  --  32  INR 1.03 1.02   BMET  Recent Labs Lab 08/10/13 0032 08/11/13 0258 08/11/13 1640  NA 136* 138 135*  K 4.4 4.5 4.4  CL 101 103 101  CO2 20 24 20   BUN 13 22 18   CREATININE 0.85 1.00 0.92  GLUCOSE 105* 114* 150*   Electrolytes  Recent Labs Lab 08/10/13 0032 08/11/13 0258 08/11/13 1640  CALCIUM 9.0 9.1 8.6  MG  --   --  1.9   Sepsis Markers No results found for this basename: LATICACIDVEN, PROCALCITON, O2SATVEN,  in the last 168 hours ABG  Recent  Labs Lab 08/11/13 1806  PHART 7.434  PCO2ART 29.0*  PO2ART 54.0*   Liver Enzymes  Recent Labs Lab 08/09/13 1416 08/10/13 0032  AST 44* 39*  ALT 84* 80*  ALKPHOS 103 129*  BILITOT 0.5 0.5  ALBUMIN 3.6 3.3*   Cardiac Enzymes No results found for this basename: TROPONINI, PROBNP,  in the last 168 hours Glucose  Recent Labs Lab 08/10/13 1124  GLUCAP 117*    Imaging Ct Angio Abd/pel W/ And/or W/o  08/10/2013   CLINICAL DATA:  58 year old with severe abdominal pain. Evaluate for aortic dissection and penetrating aortic ulceration.  EXAM: CT ANGIOGRAPHY ABDOMEN AND PELVIS  TECHNIQUE: Multidetector CT imaging of the  abdomen and pelvis was performed using the standard protocol during bolus administration of intravenous contrast. Multiplanar reconstructed images including MIPs were obtained and reviewed to evaluate the vascular anatomy.  CONTRAST:  100 mL Omnipaque 350  COMPARISON:  07/1913  FINDINGS: ARTERIAL FINDINGS:  Aorta: The descending thoracic aorta has normal caliber measuring 2.4 cm without dissection. There is no gross abnormality to the suprarenal abdominal aorta. The infrarenal abdominal aorta measures up to 3 cm and unchanged. There is concern for ulcerations along the right side of the abdominal aorta just above the IMA on sequence 401, images 120 and 122. There is low-density material surrounding the infrarenal abdominal aorta which has not significantly changed in size. Abnormal contour of the aorta below the takeoff of the inferior mesenteric artery. Concern for ulcerations at this level. The size of the aorta and surrounding inflammatory changes have not significantly changed. The aorta and adjacent stranding measures 3.4 x 2.9 cm on image 136 and previously measured 3.3 x 2.8 cm. No evidence for active contrast extravasation or bleeding.  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 The main  branches of the celiac trunk are patent.  Superior mesenteric: Patent  Left renal:          Patent  Right renal: Patent. There is either a small accessory branch just above the main right renal artery or early takeoff vessel.  Inferior mesenteric: Patent. The proximal aspect IMA is surrounded by the periaortic inflammatory changes.  Left iliac: Mild atherosclerotic disease without significant stenosis and no evidence for dissection. Left internal and external iliac arteries are patent.  Right iliac: The retroperitoneal stranding extends around the proximal right common iliac artery. Mild atherosclerotic disease without significant dilatation and no evidence for dissection. The right external and internal iliac arteries are patent.  Venous findings: Limited evaluation of the venous structures on this are arterial phase of imaging.  Review of the MIP images confirms the above findings.  NONVASCULAR FINDINGS:  There are increased dependent basilar densities in both lower lobes, right side greater than left. Findings could be associated with atelectasis. Aspiration cannot be excluded. Increased band-like density in the left lower lobe. No evidence free air. Mild stranding or edema around the inferior aspect of the liver is new. Small amount of fluid between the right kidney and the liver. Low-attenuation of the liver suggests hepatic steatosis. No gross abnormality to the adrenal glands, spleen and pancreas. Some fluid or edema between the aorta in the duodenum. There is a moderate-sized hiatal hernia.  Left inguinal hernia containing fat. Prostate is prominent for size and contains calcifications. No gross abnormality to the urinary bladder although it is distended. There is mild distention of both the small and large bowel loops. The bowel loops contain fluid and there is a large amount of stool in the right colon. Mild perinephric stranding bilaterally without hydronephrosis. No acute bone abnormality.  IMPRESSION:  Stable appearance of the infrarenal abdominal aorta. Stable irregularity of the infrarenal abdominal aorta with surrounding inflammatory changes. The aorta is mildly aneurysmal. The differential diagnosis includes an inflammatory aneurysm/aortitis versus penetrating aortic ulcerations with secondary inflammatory changes or prior bleeding. No evidence for active bleeding. No significant change in the fluid or inflammatory changes around the infrarenal abdominal aorta.  Increased densities at both lung bases. Findings most likely represent worsening atelectasis. Infection or aspiration cannot be excluded.  Fluid-filled loops of small and large bowel. Findings could represent  a developing ileus.  These results were called by telephone at the time of interpretation on 08/10/2013 at 11:06 AM to Dr. Coral ElseVANCE BRABHAM , who verbally acknowledged these results.   Electronically Signed   By: Richarda OverlieAdam  Henn M.D.   On: 08/10/2013 11:06    ASSESSMENT / PLAN:  NEUROLOGIC A:   Delirium - could be related to ETOH withdrawal as he has been reported a daily drinker by his partner. He has been calling out from the room "take me to the bar, I'll sober up there", so withdrawal remains a likley etiology. Cannot rule out intracranial process, meningitis unlikely given absent fevers and very mild WBC elevation on presentation which has now resolved.   P:   Increase precedex infusion rate to max of 552mcg/kg/hr, with careful titration.  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  HTN  P:  Management per vascular surgery -Currently on nipride drip, prn labetalol, prn hydralazine for BP control.   Close monitoring of BP with increased sedation needs.   PULMONARY A: Risk for impaired airway protection 2/2 requirements for delirium management.   P:   Supplemental O2 as needed to keep SpO2 > 92%, currently  no need.  Respiratory alkalosis on ABG likely due to agitation, will recheck if agitation unable resolve.    Joneen RoachPaul Hoffman, ACNP [facility] Pager [phone] or [phone]  Attending:  I have seen and examined the patient with nurse practitioner/resident and agree with the note above.   Tough case.  He appears to have fairly severe DTs, known alcohol use.   Ddx includes cyanide toxicity from nitroprusside, though dosing in the last 24 hours has been no higher than appropriate guardrails.    We will try to use precedex, and will also use ativan.  We may not be able to manage this successfully without intubation, but will try to avoid that.  Will stop nitroprusside and change to esmolol and check a methemoglobin level.  I have personally obtained a history, examined the patient, evaluated laboratory and imaging results, formulated the assessment and plan and placed orders. CRITICAL CARE: The patient is critically ill with multiple organ systems failure and requires high complexity decision making for assessment and support, frequent evaluation and titration of therapies, application of advanced monitoring technologies and extensive interpretation of multiple databases. Critical Care Time devoted to patient care services described in this note is 45 minutes.   Heber CarolinaBrent McQuaid, MD [facility] Pager: [phone] Cell: [phone] If no response, call [phone]

## 2013-08-11 NOTE — Transfer of Care (Signed)
Immediate Anesthesia Transfer of Care Note  Patient: Walter Morgan  Procedure(s) Performed: Procedure(s): ABDOMINAL AORTIC ENDOVASCULAR STENT GRAFT- GORE (N/A)  Patient Location: PACU  Anesthesia Type:General  Level of Consciousness: awake, alert  and confused  Airway & Oxygen Therapy: Patient Spontanous Breathing and Patient connected to nasal cannula oxygen  Post-op Assessment: Report given to PACU RN, Post -op Vital signs reviewed and stable and Patient moving all extremities  Post vital signs: Reviewed and stable  Complications: No apparent anesthesia complications

## 2013-08-11 NOTE — Op Note (Signed)
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 Blood Loss:  See anesthesia record Specimens:  None  Findings:  Complete exclusion Devices used: Main body is primary right Gore 26 x 12 x 14.  Contralateral left Gore 12x10 Indications:  The patient presented with severe abdominal pain for 1 week.  CT scan imaging revealed a penetrating ulcer was aneurysmal changes and periaortic stranding.  He was admitted for pain control.  His blood cultures have been negative.  There was a slight concern that this was mycotic or an inflammatory process.  He comes in today for endovascular repair.  Procedure:  The patient was identified in the holding area and [facility]  The patient was then placed supine on the table. general anesthesia was administered.  The patient was prepped and draped in the usual sterile fashion.  A time out was called and antibiotics were administered.  Ultrasound was used to evaluate bilateral common femoral arteries.  There are widely patent.  A digital ultrasound image was acquired.  A #11 blade was used to make a skin nick bilaterally.  Using an 18-gauge needle, bilateral common femoral arteries were cannulated under ultrasound guidance.  An 035 wire was advanced without resistance.  An 8 JamaicaFrench dilator was used to dilate the subcutaneous tract.  Pro-glide devices were deployed at the 11:00 and 1:00 position for pre-closure.  An 8 French sheath was placed bilaterally.  The patient was fully heparinized.  The Omni flush catheter was advanced up the right  side and an abdominal aortogram was performed to determine the appropriate device length.  An Amplatz superstiff wire was then placed and a 16 French sheath was advanced up the right side into the aorta.  Next, over a Amplatz superstiff wire, and a 12 French sheath was advanced up the left side into the aorta.  The main body device was prepared on the back table.  This was a Gore 26 x 12 x 14 device.  It was advanced over the wire up the right side.  A Omni flush catheter was advanced up the left in position at the level of the renal arteries.  Another abdominal aortogram was performed to locate the renal vessels.  The main body device was then deployed down to the contralateral gate.  Next, the contralateral gate was cannulated with a Kumpe catheter and a Benson wire.  A Omni flush catheter was then advanced and able to be freely rotated within the main body of the graft, confirming successful cannulation.  Next, a retrograde injection from the left side was performed.  This identified the left hypogastric artery.  The contralateral limb was a Gore 12 x 10.  This was advanced through the sheath and then successfully deployed landing just proximal to the left hypogastric artery.  Next a retrograde injection was performed through the right side and then the remaining portion of the ipsilateral device was the pulley.  The Q.-50 balloon was then used to mold the proximal and distal portions of the device as well as device overlap.  A completion arteriogram was then performed which showed successful  exclusion of the aneurysm.  There remained a widely patent bilateral renal arteries and bilateral hypogastric and external iliac arteries.  Next, a Benson wires were inserted up both sides.  The sheaths were then removed and the pro-glide devices were used to close the arteriotomy.  All wires were then removed.  50 mg of protamine was administered.  The pro-glide device suture tips were cut and the incision was closed with  4-0 Vicryl.  Dermabond was applied.  The patient continued to have well perfused feet with dorsalis pedis Doppler signals after the procedure.  There were no immediate complications.   Disposition:  To PACU in stable condition.   Juleen ChinaV. Wells Haydee Jabbour, M.D. [facility] Office: [phone] Pager:  [phone]

## 2013-08-11 NOTE — Progress Notes (Signed)
Requiring 5-6 staff memebers of op services to  physicallly keep in bed/keep safe/ he is screaming at staff, attemting to sit up/get oob

## 2013-08-11 NOTE — Progress Notes (Signed)
Remains oriented to self and intermittently place/ oftens comments he is " [place] the barn ", "at the saloon", and will sates "I am very frightened. Now let me go to the bathroom". Belligerence has mainly subsided , yet will raise his voice loudly and demand things from staff, posturing as though to lunge at staff. Remains  With upper extremities in wrist restraints and  Tape is  Stretched from one side to the other of the bed, over  Top of blankets to assist in reminding pt to keep his legs straight/ not wiggle d/t  Possibility of hematomas formation each groin/ these sites remain soft/nontender with no evidence visually or to touch of swelling/ Bilat DP pulses remain dopplerable. Hr is 110's, sbp is less than 150 by aline and cuff and his sats on 2lnc are > 93%. He mains on nipride gtt as well as  Precedex gtt at 1.421mcg/kg/hr. U/o as noted.

## 2013-08-11 NOTE — Progress Notes (Signed)
PRN ativan and haldol given per md order, pt still yelling and attempting to get out of bed.  Dr. Myra GianottiBrabham notified.  Orders received to involve CCM in management of withdrawal symptoms.  Renae FicklePaul NP to come to bedside.    Roselie AwkwardShannon Dozier Berkovich, RN

## 2013-08-11 NOTE — Anesthesia Preprocedure Evaluation (Signed)
Anesthesia Evaluation  Patient identified by MRN, date of birth, ID band Patient awake    Reviewed: Allergy & Precautions, H&P , NPO status , Patient's Chart, lab work & pertinent test results  Airway Mallampati: I TM Distance: >3 FB Neck ROM: Full    Dental   Pulmonary Current Smoker,          Cardiovascular + Peripheral Vascular Disease     Neuro/Psych    GI/Hepatic   Endo/Other  diabetes, Type 2, Oral Hypoglycemic Agents  Renal/GU      Musculoskeletal   Abdominal   Peds  Hematology   Anesthesia Other Findings   Reproductive/Obstetrics                           Anesthesia Physical Anesthesia Plan  ASA: III  Anesthesia Plan: General   Post-op Pain Management:    Induction: Intravenous  Airway Management Planned: Oral ETT  Additional Equipment: Arterial line  Intra-op Plan:   Post-operative Plan: Extubation in OR  Informed Consent: I have reviewed the patients History and Physical, chart, labs and discussed the procedure including the risks, benefits and alternatives for the proposed anesthesia with the patient or authorized representative who has indicated his/her understanding and acceptance.     Plan Discussed with: CRNA and Surgeon  Anesthesia Plan Comments:         Anesthesia Quick Evaluation

## 2013-08-11 NOTE — Progress Notes (Signed)
Pt combative, yelling loudly and pulling against wrist restraints.  Unable to calm pt.  Precedex gtt at 1.32mcg.  HR 100s-120s.  Dr. Myra GianottiBrabham notifed of pt assessment.  Orders received, will continue to monitor.

## 2013-08-12 ENCOUNTER — Inpatient Hospital Stay (HOSPITAL_COMMUNITY): Payer: BC Managed Care – PPO

## 2013-08-12 DIAGNOSIS — G9340 Encephalopathy, unspecified: Secondary | ICD-10-CM

## 2013-08-12 LAB — BASIC METABOLIC PANEL
BUN: 17 mg/dL (ref 6–23)
CHLORIDE: 104 meq/L (ref 96–112)
CO2: 22 meq/L (ref 19–32)
CREATININE: 1.04 mg/dL (ref 0.50–1.35)
Calcium: 8.1 mg/dL — ABNORMAL LOW (ref 8.4–10.5)
GFR calc Af Amer: >90 mL/min (ref >90)
GFR calc non Af Amer: 79 mL/min — ABNORMAL LOW (ref >90)
GLUCOSE: 100 mg/dL — AB (ref 70–99)
POTASSIUM: 4.4 meq/L (ref 3.7–5.3)
Sodium: 138 mEq/L (ref 137–147)

## 2013-08-12 LAB — POCT I-STAT 3, ART BLOOD GAS (G3+)
Acid-base deficit: 4 mmol/L — ABNORMAL HIGH (ref 0.0–2.0)
Bicarbonate: 21.2 mEq/L (ref 20.0–24.0)
O2 Saturation: 100 %
PH ART: 7.349 — AB (ref 7.350–7.450)
TCO2: 22 mmol/L (ref 0–100)
pCO2 arterial: 38.7 mmHg (ref 35.0–45.0)
pO2, Arterial: 188 mmHg — ABNORMAL HIGH (ref 80.0–100.0)

## 2013-08-12 LAB — LACTIC ACID, PLASMA: Lactic Acid, Venous: 1.5 mmol/L (ref 0.5–2.2)

## 2013-08-12 LAB — CBC
HEMATOCRIT: 30.7 % — AB (ref 39.0–52.0)
HEMOGLOBIN: 10.4 g/dL — AB (ref 13.0–17.0)
MCH: 32 pg (ref 26.0–34.0)
MCHC: 33.9 g/dL (ref 30.0–36.0)
MCV: 94.5 fL (ref 78.0–100.0)
Platelets: 212 10*3/uL (ref 150–400)
RBC: 3.25 MIL/uL — AB (ref 4.22–5.81)
RDW: 11.8 % (ref 11.5–15.5)
WBC: 10.2 10*3/uL (ref 4.0–10.5)

## 2013-08-12 LAB — PROCALCITONIN: PROCALCITONIN: 0.37 ng/mL

## 2013-08-12 MED ORDER — FENTANYL CITRATE 0.05 MG/ML IJ SOLN
100.0000 ug | INTRAMUSCULAR | Status: AC
  Administered 2013-08-12: 100 ug via INTRAVENOUS

## 2013-08-12 MED ORDER — BIOTENE DRY MOUTH MT LIQD
15.0000 mL | Freq: Four times a day (QID) | OROMUCOSAL | Status: DC
  Administered 2013-08-13 – 2013-08-19 (×25): 15 mL via OROMUCOSAL

## 2013-08-12 MED ORDER — SODIUM CHLORIDE 0.9 % IV SOLN
0.0000 ug/h | INTRAVENOUS | Status: DC
  Administered 2013-08-12: 50 ug/h via INTRAVENOUS
  Administered 2013-08-13: 75 ug/h via INTRAVENOUS
  Administered 2013-08-13: 150 ug/h via INTRAVENOUS
  Administered 2013-08-14: 75 ug/h via INTRAVENOUS
  Filled 2013-08-12 (×4): qty 50

## 2013-08-12 MED ORDER — PROPOFOL 10 MG/ML IV EMUL
5.0000 ug/kg/min | INTRAVENOUS | Status: DC
  Administered 2013-08-12: 10 ug/kg/min via INTRAVENOUS
  Administered 2013-08-12: 20 ug/kg/min via INTRAVENOUS
  Administered 2013-08-13: 50 ug/kg/min via INTRAVENOUS
  Administered 2013-08-13: 30 ug/kg/min via INTRAVENOUS
  Administered 2013-08-13: 35 ug/kg/min via INTRAVENOUS
  Administered 2013-08-13: 30 ug/kg/min via INTRAVENOUS
  Administered 2013-08-14: 35.159 ug/kg/min via INTRAVENOUS
  Administered 2013-08-14 (×2): 50 ug/kg/min via INTRAVENOUS
  Administered 2013-08-14 (×2): 40 ug/kg/min via INTRAVENOUS
  Filled 2013-08-12 (×12): qty 100

## 2013-08-12 MED ORDER — ROCURONIUM BROMIDE 50 MG/5ML IV SOLN
50.0000 mg | Freq: Once | INTRAVENOUS | Status: AC
  Administered 2013-08-12: 50 mg via INTRAVENOUS

## 2013-08-12 MED ORDER — FENTANYL CITRATE 0.05 MG/ML IJ SOLN
50.0000 ug | Freq: Once | INTRAMUSCULAR | Status: AC
  Administered 2013-08-12: 50 ug via INTRAVENOUS

## 2013-08-12 MED ORDER — ETOMIDATE 2 MG/ML IV SOLN
20.0000 mg | INTRAVENOUS | Status: AC
  Administered 2013-08-12: 20 mg via INTRAVENOUS

## 2013-08-12 MED ORDER — LORAZEPAM 2 MG/ML IJ SOLN
2.0000 mg | Freq: Once | INTRAMUSCULAR | Status: AC
  Administered 2013-08-12: 2 mg via INTRAVENOUS

## 2013-08-12 MED ORDER — MIDAZOLAM HCL 2 MG/2ML IJ SOLN
INTRAMUSCULAR | Status: AC
  Filled 2013-08-12: qty 4

## 2013-08-12 MED ORDER — FENTANYL BOLUS VIA INFUSION
50.0000 ug | INTRAVENOUS | Status: DC | PRN
  Filled 2013-08-12: qty 100

## 2013-08-12 MED ORDER — MIDAZOLAM HCL 2 MG/2ML IJ SOLN
2.0000 mg | Freq: Once | INTRAMUSCULAR | Status: AC
  Administered 2013-08-12: 2 mg via INTRAVENOUS

## 2013-08-12 MED ORDER — MIDAZOLAM HCL 2 MG/2ML IJ SOLN
INTRAMUSCULAR | Status: AC
  Filled 2013-08-12: qty 2

## 2013-08-12 MED ORDER — FENTANYL CITRATE 0.05 MG/ML IJ SOLN
100.0000 ug | Freq: Once | INTRAMUSCULAR | Status: AC
  Administered 2013-08-12: 100 ug via INTRAVENOUS

## 2013-08-12 MED ORDER — FENTANYL CITRATE 0.05 MG/ML IJ SOLN
INTRAMUSCULAR | Status: AC
  Filled 2013-08-12: qty 2

## 2013-08-12 MED ORDER — MIDAZOLAM HCL 2 MG/2ML IJ SOLN
4.0000 mg | INTRAMUSCULAR | Status: AC
  Administered 2013-08-12: 4 mg via INTRAVENOUS

## 2013-08-12 MED ORDER — ASPIRIN 81 MG PO CHEW
81.0000 mg | CHEWABLE_TABLET | Freq: Every day | ORAL | Status: DC
  Administered 2013-08-12 – 2013-08-19 (×7): 81 mg
  Filled 2013-08-12 (×6): qty 1

## 2013-08-12 MED ORDER — CHLORHEXIDINE GLUCONATE 0.12 % MT SOLN
15.0000 mL | Freq: Two times a day (BID) | OROMUCOSAL | Status: DC
  Administered 2013-08-12 – 2013-08-19 (×14): 15 mL via OROMUCOSAL
  Filled 2013-08-12 (×17): qty 15

## 2013-08-12 MED ORDER — SODIUM CHLORIDE 0.9 % IV SOLN
1.0000 mg/h | INTRAVENOUS | Status: DC
  Administered 2013-08-12: 2 mg/h via INTRAVENOUS
  Filled 2013-08-12: qty 10

## 2013-08-12 MED ORDER — FENTANYL CITRATE 0.05 MG/ML IJ SOLN
INTRAMUSCULAR | Status: AC
  Filled 2013-08-12: qty 4

## 2013-08-12 MED ORDER — LORAZEPAM 2 MG/ML IJ SOLN
1.0000 mg | INTRAMUSCULAR | Status: DC | PRN
  Administered 2013-08-12 – 2013-08-17 (×15): 2 mg via INTRAVENOUS
  Administered 2013-08-19: 1 mg via INTRAVENOUS
  Filled 2013-08-12 (×16): qty 1

## 2013-08-12 NOTE — Progress Notes (Signed)
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 His partner reports that he has been a daily drinker for some time, and has had experiences like this before. Also states, however that he may not have had a drink for 2 weeks. He presented to the ER 4/19 with approximately 1 week of abdominal and back pain. He had been seen bby his PCP and treated with muscle relaxers and narcotics without relief. He reported no aggravating or relieving factors. 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 He had surgical repair of the aneurysm 4/21. Postoperatively he was combative with staff and disoriented. He was started on precedex infusion with some improvement. 4/21 PM he remains agitated and screaming at staff, trying to climb out of bed. PCCM asked to see for help managing delirium.     SIGNIFICANT EVENTS / STUDIES:  4/21 CT abodmoen >>> Abnormal retroperitoneal stranding surrounding a focal dissection and possible intramural hematoma along a minimally aneurysmal segment of the infrarenal abdominal aorta. 3.3 x 2.8cm. 4/21 AAA repair  LINES / TUBES: Left Radial Art Line 4/21 (accuracy uncertain)>>>? date RIJ CVL 4/21 >>> Foley 4/21 >>>  CULTURES: Blood 4/19 >>>  ANTIBIOTICS: Vanc 4/19 >>>   SUBJECTIVE:   08/12/13: On precedex 1.2, esmolol and just got ativan 2mg : RASS +3 with all this  VITAL SIGNS: Temp:  [97.6 F (36.4 C)-103.2 F (39.6 C)] 97.8 F (36.6 C) (04/22 0725) Pulse Rate:  [69-136] 83 (04/22 0930) Resp:  [18-37] 19 (04/22 0930) BP: (103-180)/(56-92) 131/92 mmHg (04/22 0930) SpO2:  [85 %-100 %] 98 % (04/22 0930) Arterial Line BP: (72-188)/(44-177)  188/177 mmHg (04/21 2000) HEMODYNAMICS:   VENTILATOR SETTINGS:   INTAKE / OUTPUT: Intake/Output     04/21 0701 - 04/22 0700 04/22 0701 - 04/23 0700   I.V. (mL/kg) 5462 (49) 214.1 (1.9)   IV Piggyback 400    Total Intake(mL/kg) 5862 (52.6) 214.1 (1.9)   Urine (mL/kg/hr) 3110 (1.2) 75 (0.2)   Blood     Total Output 3110 75   Net +2752 +139.1          PHYSICAL EXAMINATION: General:  Overweight male (245 lbs) anxious and combative in bed.  Neuro:  Alert, disoriented. Does not follow commands. Screaming phrases that do not make sense HEENT:  [place]/AT, PERRL Cardiovascular:  Tachy Lungs:  Clear anteriorly Abdomen:  Soft, non-distended Musculoskeletal:  No acute deformity/ROM limitations Skin:  Intact  LABS:  CBC  Recent Labs Lab 08/11/13 0258 08/11/13 1640 08/12/13 0359  WBC 10.6* 9.3 10.2  HGB 11.9* 10.9* 10.4*  HCT 35.4* 32.4* 30.7*  PLT 268 250 212   Coag's  Recent Labs Lab 08/10/13 0032 08/11/13 1640  APTT  --  32  INR 1.03 1.02   BMET  Recent Labs Lab 08/11/13 0258 08/11/13 1640 08/12/13 0359  NA 138 135* 138  K 4.5 4.4 4.4  CL 103 101 104  CO2 24 20 22   BUN 22 18 17   CREATININE 1.00 0.92 1.04  GLUCOSE 114* 150* 100*   Electrolytes  Recent Labs Lab 08/11/13 0258 08/11/13 1640 08/12/13 0359  CALCIUM 9.1 8.6 8.1*  MG  --  1.9  --  Sepsis Markers  Recent Labs Lab 08/12/13 0030  LATICACIDVEN 1.5   ABG  Recent Labs Lab 08/11/13 1806  PHART 7.434  PCO2ART 29.0*  PO2ART 54.0*   Liver Enzymes  Recent Labs Lab 08/09/13 1416 08/10/13 0032  AST 44* 39*  ALT 84* 80*  ALKPHOS 103 129*  BILITOT 0.5 0.5  ALBUMIN 3.6 3.3*   Cardiac Enzymes No results found for this basename: TROPONINI, PROBNP,  in the last 168 hours Glucose  Recent Labs Lab 08/10/13 1124  GLUCAP 117*    Imaging Dg Chest Port 1 View  08/12/2013   CLINICAL DATA:  Respiratory failure.  EXAM: PORTABLE CHEST - 1 VIEW  COMPARISON:  DG ABD ACUTE W/CHEST dated  08/09/2013  FINDINGS: Right IJ line in good anatomic position. Cardiomegaly. Diffuse prominent bilateral alveolar infiltrates noted throughout both lungs. These findings are consistent with pulmonary edema. Bilateral pneumonia cannot be excluded. No pleural effusion or pneumothorax.  IMPRESSION: 1. Right IJ line in good anatomic position. 2. Congestive heart failure and bilateral pulmonary edema. Other etiologies of bilateral pulmonary infiltrates including pneumonia cannot be excluded.   Electronically Signed   By: Maisie Fus  Register   On: 08/12/2013 08:19   Ct Angio Abd/pel W/ And/or W/o  08/10/2013   CLINICAL DATA:  58 year old with severe abdominal pain. Evaluate for aortic dissection and penetrating aortic ulceration.  EXAM: CT ANGIOGRAPHY ABDOMEN AND PELVIS  TECHNIQUE: Multidetector CT imaging of the abdomen and pelvis was performed using the standard protocol during bolus administration of intravenous contrast. Multiplanar reconstructed images including MIPs were obtained and reviewed to evaluate the vascular anatomy.  CONTRAST:  100 mL Omnipaque 350  COMPARISON:  07/1913  FINDINGS: ARTERIAL FINDINGS:  Aorta: The descending thoracic aorta has normal caliber measuring 2.4 cm without dissection. There is no gross abnormality to the suprarenal abdominal aorta. The infrarenal abdominal aorta measures up to 3 cm and unchanged. There is concern for ulcerations along the right side of the abdominal aorta just above the IMA on sequence 401, images 120 and 122. There is low-density material surrounding the infrarenal abdominal aorta which has not significantly changed in size. Abnormal contour of the aorta below the takeoff of the inferior mesenteric artery. Concern for ulcerations at this level. The size of the aorta and surrounding inflammatory changes have not significantly changed. The aorta and adjacent stranding measures 3.4 x 2.9 cm on image 136 and previously measured 3.3 x 2.8 cm. No evidence for active  contrast extravasation or bleeding.  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 The main branches of the celiac trunk are patent.  Superior mesenteric: Patent  Left renal:          Patent  Right renal: Patent. There is either a small accessory branch just above the main right renal artery or early takeoff vessel.  Inferior mesenteric: Patent. The proximal aspect IMA is surrounded by the periaortic inflammatory changes.  Left iliac: Mild atherosclerotic disease without significant stenosis and no evidence for dissection. Left internal and external iliac arteries are patent.  Right iliac: The retroperitoneal stranding extends around the proximal right common iliac artery. Mild atherosclerotic disease without significant dilatation and no evidence for dissection. The right external and internal iliac arteries are patent.  Venous findings: Limited evaluation of the venous structures on this are arterial phase of imaging.  Review of the MIP images confirms the above findings.  NONVASCULAR FINDINGS:  There are increased dependent basilar  densities in both lower lobes, right side greater than left. Findings could be associated with atelectasis. Aspiration cannot be excluded. Increased band-like density in the left lower lobe. No evidence free air. Mild stranding or edema around the inferior aspect of the liver is new. Small amount of fluid between the right kidney and the liver. Low-attenuation of the liver suggests hepatic steatosis. No gross abnormality to the adrenal glands, spleen and pancreas. Some fluid or edema between the aorta in the duodenum. There is a moderate-sized hiatal hernia.  Left inguinal hernia containing fat. Prostate is prominent for size and contains calcifications. No gross abnormality to the urinary bladder although it is distended. There is mild distention of both the small and large bowel loops. The bowel  loops contain fluid and there is a large amount of stool in the right colon. Mild perinephric stranding bilaterally without hydronephrosis. No acute bone abnormality.  IMPRESSION: Stable appearance of the infrarenal abdominal aorta. Stable irregularity of the infrarenal abdominal aorta with surrounding inflammatory changes. The aorta is mildly aneurysmal. The differential diagnosis includes an inflammatory aneurysm/aortitis versus penetrating aortic ulcerations with secondary inflammatory changes or prior bleeding. No evidence for active bleeding. No significant change in the fluid or inflammatory changes around the infrarenal abdominal aorta.  Increased densities at both lung bases. Findings most likely represent worsening atelectasis. Infection or aspiration cannot be excluded.  Fluid-filled loops of small and large bowel. Findings could represent a developing ileus.  These results were called by telephone at the time of interpretation on 08/10/2013 at 11:06 AM to Dr. Coral ElseVANCE BRABHAM , who verbally acknowledged these results.   Electronically Signed   By: Richarda OverlieAdam  Henn M.D.   On: 08/10/2013 11:06    ASSESSMENT / PLAN:  NEUROLOGIC A:   Delirium - could be related to ETOH withdrawal as he has been reported a daily drinker by his partner. He has been calling out from the room "take me to the bar, I'll sober up there", so withdrawal remains a likley etiology. Cannot rule out intracranial process, meningitis unlikely given absent fevers and very mild WBC elevation on presentation which has now resolved.    - severe delirium persists. Preventing PO intake  P:   Increase precedex infusion rate to max of 1.194mcg/kg/hr, with careful titration.  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  HTN  - Nipride chagned to esmolol  P:  Management per vascular  surgery -Currently on esmolol drip, prn labetalol, prn hydralazine for BP control.   Close monitoring of BP with increased sedation needs.   PULMONARY A: Risk for impaired airway protection 2/2 requirements for delirium management.   P:   Supplemental O2 as needed to keep SpO2 > 92%, currently no need.  Intubate if worse; likely heading there   GI A: At risk for aspiration P NPO Dc all po meds   STAFF GLOBAL NOTE significan other Roko updated   The patient is critically ill with multiple organ systems failure and requires high complexity decision making for assessment and support, frequent evaluation and titration of therapies, application of advanced monitoring technologies and extensive interpretation of multiple databases.   Critical Care Time devoted to patient care services described in this note is  45  Minutes.  Dr. Kalman ShanMurali Lamya Lausch, M.D., [facility].C.P Pulmonary and Critical Care Medicine Staff Physician [facility] [place] Pulmonary and Critical Care Pager:  [phone], If no answer or between  15:00h - 7:00h: call [phone]  08/12/2013 9:52 AM

## 2013-08-12 NOTE — Progress Notes (Signed)
Dr Marchelle Gearingamaswamy updated at bedside. Md updated pt on precedex gtt, increased to 1.4 mcg per MD order. MD updated ativan, 2mg  IV, just given, pt remains extremely agitated/restless, attempting to get OOB. MD stated give additional 2mg  IV ativan now.  MD updated pt partner at bedside and discussed potential for intubation.  Pts breathing labored at times with agitation. End tital CO2 monitoring in place with range in 20s. Pt on esmolol gtt at 175mcg for goal sbp<130. Will continue to monitor. Walter BoundJennifer Blinda Morgan

## 2013-08-12 NOTE — Anesthesia Postprocedure Evaluation (Signed)
Anesthesia Post Note  Patient: Walter Morgan  Procedure(s) Performed: Procedure(s) (LRB): ABDOMINAL AORTIC ENDOVASCULAR STENT GRAFT- GORE (N/A)  Anesthesia type: general  Patient location: PACU  Post pain: Pain level controlled  Post assessment: Patient's Cardiovascular Status Stable  Last Vitals:  Filed Vitals:   08/12/13 0930  BP: 131/92  Pulse: 83  Temp:   Resp: 19    Post vital signs: Reviewed and stable  Level of consciousness: sedated  Complications: No apparent anesthesia complications

## 2013-08-12 NOTE — Progress Notes (Addendum)
[facility]  Subjective  - Patient is sleeping, some what arousable at times.  Currently on precedex and ativan.     Objective 127/78 75 97.8 F (36.6 C) (Oral) 25 98%  Intake/Output Summary (Last 24 hours) at 08/12/13 0809 Last data filed at 08/12/13 0700  Gross per 24 hour  Intake 5695.17 ml  Output   3110 ml  Net 2585.17 ml   Feet are well perfused, doppler signals PT/DP 2+ Groins are soft and without hematoma    Assessment/Planning: POD #1  Procedure: #1: Endovascular repair of abdominal aortic aneurysm  #2: Catheter in aorta x2  #3: Abdominal aortogram  #4: Bilateral ultrasound guided common femoral artery access  Precedex will be weaned, BP well controled 120's/70's, SAT 98  Recommendations per CCM: Tough case. He appears to have fairly severe DTs, known alcohol use. Ddx includes cyanide toxicity from nitroprusside, though dosing in the last 24 hours has been no higher than appropriate guardrails.   We will try to use precedex, and will also use ativan. We may not be able to manage this successfully without intubation, but will try to avoid that.   Will stop nitroprusside and change to esmolol and check a methemoglobin level.      Lars Magemma M Collins 08/12/2013 8:09 AM --  Laboratory Lab Results:  Recent Labs  08/11/13 1640 08/12/13 0359  WBC 9.3 10.2  HGB 10.9* 10.4*  HCT 32.4* 30.7*  PLT 250 212   BMET  Recent Labs  08/11/13 1640 08/12/13 0359  NA 135* 138  K 4.4 4.4  CL 101 104  CO2 20 22  GLUCOSE 150* 100*  BUN 18 17  CREATININE 0.92 1.04  CALCIUM 8.6 8.1*    COAG Lab Results  Component Value Date   INR 1.02 08/11/2013   INR 1.03 08/10/2013   No results found for this basename: PTT

## 2013-08-12 NOTE — Progress Notes (Signed)
Dr Marchelle Gearingamaswamy updated on ABG 1 hour post intubation. MD ok to stop CIWA protocol while intubated. MD stated to d/c versed and start pt on IV propofol, continue IV fentanyl gtt. MD updated current rates of versed and fentanyl gtts. Goal RASS 0 to -2 while intubated. Will continue to monitor. Koren BoundJennifer Maleaha Hughett

## 2013-08-12 NOTE — Progress Notes (Signed)
Pt intubated per Dr Marchelle Gearingamaswamy, intubated by Brett CanalesSteve Minor NP.  Pt partner at bedside, aware of planned intubation. Dr Marchelle Gearingamaswamy had discussed potential need for intubation with partner at bedside with morning rounds. 200 mcg IV fentanyl given, 20 mg etomidate, 4 mg IV versed given for intubation. Will continue to monitor. Koren BoundJennifer Tomica Arseneault

## 2013-08-12 NOTE — Procedures (Signed)
Staffed procedure at bedside   Dr. Kalman ShanMurali Adi Seales, M.D., [facility].C.C.P Pulmonary and Critical Care Medicine Staff Physician [facility] Pulmonary and Critical Care Pager: [phone], If no answer or between  15:00h - 7:00h: call [phone]  08/12/2013 11:30 AM

## 2013-08-12 NOTE — Procedures (Signed)
Intubation Procedure Note Walter Morgan [id number] 1956/01/01  Procedure: Intubation Indications: Respiratory insufficiency  Procedure Details Consent: Unable to obtain consent because of altered level of consciousness. Time Out: Verified patient identification, verified procedure, site/side was marked, verified correct patient position, special equipment/implants available, medications/allergies/relevent history reviewed, required imaging and test results available.  Performed  MAC and 3 Medications:  Fentanyl 400 mcg Etomidate 20 mg Versed 4 mg NMB none   Evaluation Hemodynamic Status: BP stable throughout; O2 sats: transiently fell during during procedure Patient's Current Condition: stable Complications: No apparent complications Patient did tolerate procedure well. Chest X-ray ordered to verify placement.  CXR: pending.   Walter Morgan ACNP Walter Morgan PCCM Pager [phone] till 3 pm If no answer page [phone] 08/12/2013, 11:17 AM

## 2013-08-13 ENCOUNTER — Encounter (HOSPITAL_COMMUNITY): Payer: Self-pay | Admitting: Surgery

## 2013-08-13 ENCOUNTER — Inpatient Hospital Stay (HOSPITAL_COMMUNITY): Payer: BC Managed Care – PPO

## 2013-08-13 DIAGNOSIS — J9601 Acute respiratory failure with hypoxia: Secondary | ICD-10-CM

## 2013-08-13 DIAGNOSIS — J9600 Acute respiratory failure, unspecified whether with hypoxia or hypercapnia: Secondary | ICD-10-CM

## 2013-08-13 LAB — MAGNESIUM: MAGNESIUM: 2.1 mg/dL (ref 1.5–2.5)

## 2013-08-13 LAB — PROCALCITONIN: Procalcitonin: 0.36 ng/mL

## 2013-08-13 LAB — BASIC METABOLIC PANEL
BUN: 17 mg/dL (ref 6–23)
CALCIUM: 8 mg/dL — AB (ref 8.4–10.5)
CO2: 19 mEq/L (ref 19–32)
Chloride: 107 mEq/L (ref 96–112)
Creatinine, Ser: 0.89 mg/dL (ref 0.50–1.35)
GFR calc Af Amer: >90 mL/min (ref >90)
GFR calc non Af Amer: >90 mL/min (ref >90)
GLUCOSE: 77 mg/dL (ref 70–99)
Potassium: 4 mEq/L (ref 3.7–5.3)
Sodium: 140 mEq/L (ref 137–147)

## 2013-08-13 LAB — TROPONIN I: Troponin I: <0.3 ng/mL (ref <0.30)

## 2013-08-13 LAB — GLUCOSE, CAPILLARY
GLUCOSE-CAPILLARY: 63 mg/dL — AB (ref 70–99)
GLUCOSE-CAPILLARY: 95 mg/dL (ref 70–99)
Glucose-Capillary: 109 mg/dL — ABNORMAL HIGH (ref 70–99)
Glucose-Capillary: 79 mg/dL (ref 70–99)
Glucose-Capillary: 91 mg/dL (ref 70–99)

## 2013-08-13 LAB — CBC
HCT: 28.9 % — ABNORMAL LOW (ref 39.0–52.0)
HEMOGLOBIN: 9.6 g/dL — AB (ref 13.0–17.0)
MCH: 31.4 pg (ref 26.0–34.0)
MCHC: 33.2 g/dL (ref 30.0–36.0)
MCV: 94.4 fL (ref 78.0–100.0)
Platelets: 222 10*3/uL (ref 150–400)
RBC: 3.06 MIL/uL — AB (ref 4.22–5.81)
RDW: 12 % (ref 11.5–15.5)
WBC: 10 10*3/uL (ref 4.0–10.5)

## 2013-08-13 LAB — PHOSPHORUS: Phosphorus: 2.5 mg/dL (ref 2.3–4.6)

## 2013-08-13 MED ORDER — DEXTROSE 50 % IV SOLN
25.0000 mL | Freq: Once | INTRAVENOUS | Status: AC
  Administered 2013-08-13: 25 mL via INTRAVENOUS

## 2013-08-13 MED ORDER — DEXTROSE 50 % IV SOLN
INTRAVENOUS | Status: AC
  Filled 2013-08-13: qty 50

## 2013-08-13 MED ORDER — PIPERACILLIN-TAZOBACTAM 3.375 G IVPB
3.3750 g | Freq: Three times a day (TID) | INTRAVENOUS | Status: DC
  Administered 2013-08-13 – 2013-08-14 (×4): 3.375 g via INTRAVENOUS
  Filled 2013-08-13 (×5): qty 50

## 2013-08-13 MED ORDER — PRO-STAT SUGAR FREE PO LIQD
30.0000 mL | Freq: Four times a day (QID) | ORAL | Status: DC
  Administered 2013-08-13 – 2013-08-15 (×7): 30 mL
  Filled 2013-08-13 (×20): qty 30

## 2013-08-13 MED ORDER — DEXTROSE 50 % IV SOLN: 50.0000 mL | Freq: Once | INTRAVENOUS | Status: DC

## 2013-08-13 MED ORDER — VANCOMYCIN HCL 10 G IV SOLR
1250.0000 mg | Freq: Three times a day (TID) | INTRAVENOUS | Status: DC
  Administered 2013-08-13 – 2013-08-14 (×3): 1250 mg via INTRAVENOUS
  Filled 2013-08-13 (×4): qty 1250

## 2013-08-13 MED ORDER — PANTOPRAZOLE SODIUM 40 MG PO PACK
40.0000 mg | PACK | Freq: Every day | ORAL | Status: DC
  Administered 2013-08-13 – 2013-08-14 (×2): 40 mg
  Filled 2013-08-13 (×4): qty 20

## 2013-08-13 MED ORDER — BISACODYL 10 MG RE SUPP
10.0000 mg | Freq: Every day | RECTAL | Status: DC | PRN
  Administered 2013-08-13: 10 mg via RECTAL
  Filled 2013-08-13: qty 1

## 2013-08-13 MED ORDER — FUROSEMIDE 10 MG/ML IJ SOLN
20.0000 mg | Freq: Once | INTRAMUSCULAR | Status: AC
  Administered 2013-08-13: 20 mg via INTRAVENOUS
  Filled 2013-08-13: qty 2

## 2013-08-13 MED ORDER — VANCOMYCIN HCL 10 G IV SOLR
1500.0000 mg | Freq: Once | INTRAVENOUS | Status: AC
  Administered 2013-08-13: 1500 mg via INTRAVENOUS
  Filled 2013-08-13: qty 1500

## 2013-08-13 MED ORDER — HEPARIN SODIUM (PORCINE) 5000 UNIT/ML IJ SOLN
5000.0000 [IU] | Freq: Two times a day (BID) | INTRAMUSCULAR | Status: DC
  Administered 2013-08-13 (×2): 5000 [IU] via SUBCUTANEOUS
  Filled 2013-08-13 (×4): qty 1

## 2013-08-13 MED ORDER — INSULIN ASPART 100 UNIT/ML ~~LOC~~ SOLN
1.0000 [IU] | SUBCUTANEOUS | Status: DC
  Administered 2013-08-14 – 2013-08-18 (×6): 1 [IU] via SUBCUTANEOUS

## 2013-08-13 MED ORDER — VITAL HIGH PROTEIN PO LIQD
1000.0000 mL | ORAL | Status: DC
  Administered 2013-08-13: 1000 mL
  Administered 2013-08-13: 22:00:00
  Administered 2013-08-14: 1000 mL
  Administered 2013-08-14 – 2013-08-15 (×2)
  Filled 2013-08-13 (×6): qty 1000

## 2013-08-13 NOTE — Progress Notes (Addendum)
[facility]  Subjective  - Intubated yesterday around noon.  He was uncontrolled and stated he was going to leave.     Objective 108/63 93 99.8 F (37.7 C) (Axillary) 20 99%  Intake/Output Summary (Last 24 hours) at 08/13/13 0730 Last data filed at 08/13/13 0700  Gross per 24 hour  Intake 5014.77 ml  Output   2725 ml  Net 2289.77 ml    Doppler signals 2+ bil. LE Incisions with out hematoma   Assessment/Planning: POD #2 Procedure: #1: Endovascular repair of abdominal aortic aneurysm  #2: Catheter in aorta x2  #3: Abdominal aortogram  #4: Bilateral ultrasound guided common femoral artery access  Intubated per CCM Bout of A-fib now NS NG tube secondary to intubation for PO meds No BM in a week will order dulcolax suppository     Lars Magemma M Collins 08/13/2013 7:30 AM --  Laboratory Lab Results:  Recent Labs  08/11/13 1640 08/12/13 0359  WBC 9.3 10.2  HGB 10.9* 10.4*  HCT 32.4* 30.7*  PLT 250 212   BMET  Recent Labs  08/12/13 0359 08/13/13 0425  NA 138 140  K 4.4 4.0  CL 104 107  CO2 22 19  GLUCOSE 100* 77  BUN 17 17  CREATININE 1.04 0.89  CALCIUM 8.1* 8.0*    COAG Lab Results  Component Value Date   INR 1.02 08/11/2013   INR 1.03 08/10/2013   No results found for this basename: PTT      I agree with the above.  Pt is POD #2 s/p EVAR for PAU / inflamatory aneurysm. Patient was intubated yesterday.  He is sedated today.  Pt responds to questioning and denies abdominal pain His abdomen is soft as are his access sites in each groin.  Both feet are warm with doppler signals  ID:  Patient spiked a high fever on POD#1 and has been intermittently febrile.  Source unknown.  Trach aspirate to be sent today.  CXR shows possible pneumonia.  CT scan pre-op suggests possible aspiration, however the patient did not show signs of aspiration pre-op.  Also, there is the possibility this was a mycotic aneurysm.  The patient  did not have any risk factors, but his CT scan could be consistent with this.  His blood cultures have been negative so far.  HE will be started on emperic antibiotics  Acute post-operative delerium:  Possibly related to sepsis, anesthesia/narcotics, or ETHO withdrawal.  CIWA protocol initiated  GI:  OK to start enteral feeds  GU:  Good UOP.  Will give lasix dose as CXR looks like volume excess/CHF  Cardiac:  Post of AFIB.  Rate controlled on esmolol.  Will continue.  Prophylaxis:  SQ heparin  I spoke with partner and family at length about the patients condition and answered all of their question.  Durene CalWells Brabham

## 2013-08-13 NOTE — Clinical Documentation Improvement (Signed)
Possible Clinical Conditions?    Aspiration Pneumonia (POA?) Gram Negative Pneumonia (POA?)  Bacterial pneumonia, specify type if known (POA?) Klebsiella PNA E Coli PNA Pseudomonas PNA Candidiasis PNA Staph/MRSA PNA Strep PNA Pneumococcal PNA H Influenza PNA H Para Influenza PNA  Pneumonia (CAP, HAP) (POA?) Other Condition Cannot Clinically Determine     Risk Factors: Tracheal aspirate and CXR suggests likely pneumonia per 4/23 progress notes.  Diagnostics: 08/10/13: CT angio abdomen/pelvis: Aspiration cannot be excluded.   Thank You, Marciano SequinWanda Mathews-Bethea,RN,BSN, Clinical Documentation Specialist:  [phone]  [facility]- Health Information Management

## 2013-08-13 NOTE — Progress Notes (Signed)
Versed gtt taken down/discontinued per MD. 25mL waste in sink with gtt d/c. Walter PeltonKari Stewart witness waste. Koren BoundJennifer Desarie Feild

## 2013-08-13 NOTE — Progress Notes (Addendum)
CRITICAL VALUE ALERT  Critical value received:  CBG 63  Date of notification:  08/13/2013  Time of notification:  1257  Critical value read back: yes  Nurse who received alert:  Koren BoundJennifer Dimitry Holsworth RN  MD notified (1st page):  Dr Marchelle Gearingamaswamy  Time of first page:  1315  MD notified (2nd page):  Time of second page:  Responding MD:  Dr Marchelle Gearingamaswamy  Time MD responded:  1325  MD updated pt given 25 mL D50 per protocol, recheck cbg 91, orders received to begin patient on q4 CBG monitoring

## 2013-08-13 NOTE — Progress Notes (Signed)
Dr Marchelle Gearingamaswamy updated at bedside. MD updated unable to lessen sedation with AM assessment. Pt restless in bed, attempting to pull at lines. Patient will open eyes to speech, eye contact not sustained. Intermittently follows commands, responds to and localizes painful stimuli.  Pt currently on 30mcg diprivan and 14450mcg/hr IV fentanyl. PRN ativan ordered. MD stated to use PRN ativan when agitated and attempt to wean fentanyl gtt as able, continue with IV diprivan. MD stated no CPAP/weaning trials today. MD updated pt in Afib vs SR with PAC. HR 80-100s. MD updated AM labs. MD updated sclera bilaterally reddened. Per 7p report, when pt agitated OGT output blood tinge but will return to bile/brown. MD updated LBM prior to admission, discussed with surgery PA, ducolax suppository ordered. MD updated OGT output overnight, consult to begin tube feeding ordered. Pt RASS range -2 to +2, pt can become agitated with no stimulation, arouses on own. Will continue with diprivan, wean fentanyl gtt as able and use PRN ativan. Pt remains on esmolol gtt for BP goal <130 or MAP<70. Will continue to monitor. Koren BoundJennifer Karlos Scadden

## 2013-08-13 NOTE — Progress Notes (Signed)
Dr. Herma CarsonZ CCM notified of rhythm fluctuation from NSR with frequent PACs and a-fib. Rate 80s-105. No prior history of this per Epic. Will send Bmet to check electrolytes. Thresa RossA. Vernis Eid RN

## 2013-08-13 NOTE — Progress Notes (Signed)
Fentanyl gtt bag changed. 20mL waste with bag change, volume wasted in sink. Marlane HatcherPatty Keaton RN witness waste. Koren BoundJennifer Dickson Kostelnik

## 2013-08-13 NOTE — Progress Notes (Signed)
INITIAL NUTRITION ASSESSMENT  DOCUMENTATION CODES Per approved criteria  -Obesity Unspecified   INTERVENTION: Initiate Vital HP formula at 20 ml/hr and increase by 10 ml every 4 hours to goal rate of 50 ml/hr with Prostat liquid protein 30 ml QID to provide 1600 kcals (63% of estimated kcal needs), 165 gm protein (100% of estimated protein needs), 1003 ml of free water RD to follow for nutrition care plan  NUTRITION DIAGNOSIS: Inadequate oral intake related to inability to eat as evidenced by NPO status  Goal: Enteral nutrition to provide 60-70% of estimated calorie needs (22-25 kcals/kg ideal body weight) and 100% of estimated protein needs, based on ASPEN guidelines for permissive underfeeding in critically ill obese individuals  Monitor:  TF regimen & tolerance, respiratory status, weight, labs, I/O's  Reason for Assessment: Consult  58 y.o. male  Admitting Dx: abdominal pain  ASSESSMENT: 58 year old presented to the ER tonight with approximately 1 week of abdominal and back pain. He had been seen by his PCP and treated with muscle relaxers and narcotics without relief. HE reports no aggravating or relieving factors.   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  Patient s/p procedures 4/21: ENDOVASCULAR REPAIR OF ABDOMINAL AORTIC ANEURYSM  Patient is currently intubated on ventilator support -- OGT in place MV: 12.5 L/min Temp (24hrs), Avg:99.1 F (37.3 C), Min:97.4 F (36.3 C), Max:101.5 F (38.6 C)   RD consulted for TF initiation & management.  Height: Ht Readings from Last 1 Encounters:  08/09/13 6\' 1"  (1.854 m)    Weight: Wt Readings from Last 1 Encounters:  08/09/13 245 lb 9.5 oz (111.4 kg)    Ideal Body Weight: 184 lb  % Ideal Body Weight: 133%  Wt Readings from Last 10 Encounters:  08/09/13 245 lb 9.5 oz (111.4 kg)  08/09/13 245 lb 9.5 oz (111.4 kg)    Usual Body Weight: unable to obtain  % Usual Body Weight: ---  BMI:  Body  mass index is 32.41 kg/(m^2).  Estimated Nutritional Needs: Kcal: 2541 Protein: 160-170 gm Fluid: per MD  Skin: Intact  Diet Order: NPO  EDUCATION NEEDS: -No education needs identified at this time   Intake/Output Summary (Last 24 hours) at 08/13/13 1004 Last data filed at 08/13/13 0900  Gross per 24 hour  Intake 4707.17 ml  Output   3150 ml  Net 1557.17 ml    Labs:   Recent Labs Lab 08/11/13 0258 08/11/13 1640 08/12/13 0359 08/13/13 0425  NA 138 135* 138 140  K 4.5 4.4 4.4 4.0  CL 103 101 104 107  CO2 24 20 22 19   BUN 22 18 17 17   CREATININE 1.00 0.92 1.04 0.89  CALCIUM 9.1 8.6 8.1* 8.0*  MG  --  1.9  --  2.1  PHOS  --   --   --  2.5  GLUCOSE 114* 150* 100* 77    CBG (last 3)   Recent Labs  08/10/13 1124  GLUCAP 117*    Scheduled Meds: . antiseptic oral rinse  15 mL Mouth Rinse QID  . aspirin  81 mg Per Tube Daily  . atorvastatin  10 mg Oral q1800  . chlorhexidine  15 mL Mouth Rinse BID  . fluticasone  1 spray Each Nare Daily  . folic acid  1 mg Oral Daily   Or  . folic acid  1 mg Intravenous Daily  . heparin subcutaneous  5,000 Units Subcutaneous Q12H  . LORazepam  1 mg Intravenous Once  .  multivitamin with minerals  1 tablet Oral Daily  . pantoprazole sodium  40 mg Per Tube Q1200  . piperacillin-tazobactam (ZOSYN)  IV  3.375 g Intravenous Q8H  . thiamine  100 mg Oral Daily   Or  . thiamine IV  100 mg Intravenous Daily  . vancomycin  1,250 mg Intravenous Q8H  . vancomycin  1,500 mg Intravenous Once    Continuous Infusions: . sodium chloride 20 mL/hr (08/13/13 0827)  . esmolol 80 mcg/kg/min (08/13/13 0900)  . fentaNYL infusion INTRAVENOUS 120 mcg/hr (08/13/13 0900)  . propofol 30 mcg/kg/min (08/13/13 0800)    Past Medical History  Diagnosis Date  . GERD (gastroesophageal reflux disease)   . Diabetes     possibly early stage, not formally diagnosed- MD wanted to start on metformin- is not taking per patient   . H/O hiatal hernia      repaired    Past Surgical History  Procedure Laterality Date  . Appendectomy    . Cholecystectomy    . Hernia repair      Maureen ChattersKatie Kip Cropp, RD, LDN Pager #: [phone] After-Hours Pager #: [phone]

## 2013-08-13 NOTE — Progress Notes (Signed)
Spoke with Dr. Sung AmabileSimonds regarding agitation and increased work of breathing post bath. MD camera in room to see patient. Suggested to increase propofol at this time for sedation. Increased propofol from 35mcg to 50mcg. Within 15 min, patient appears to be more comfortable and breathing is less labored. Thresa RossA. Sabrina Keough RN

## 2013-08-13 NOTE — Progress Notes (Signed)
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 His partner reports that he has been a daily drinker for some time, and has had experiences like this before. Also states, however that he may not have had a drink for 2 weeks. He presented to the ER 4/19 with approximately 1 week of abdominal and back pain. He had been seen bby his PCP and treated with muscle relaxers and narcotics without relief. He reported no aggravating or relieving factors. 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 He had surgical repair of the aneurysm 4/21. Postoperatively he was combative with staff and disoriented. He was started on precedex infusion with some improvement. 4/21 PM he remains agitated and screaming at staff, trying to climb out of bed. PCCM asked to see for help managing delirium.    LINES / TUBES: Left Radial Art Line 4/21 (accuracy uncertain)>>>? date RIJ CVL 4/21 >>> Foley 4/21 >>> ETT 4/22 >> OG tube 4/22 >>  CULTURES: Blood 4/19 >>> .Marland Kitchen Trach aspirate 4/23 Urine 4/23 >>  ANTIBIOTICS: Vanc 4/19 >>>    SIGNIFICANT EVENTS / STUDIES:  4/21 CT abodmoen >>> Abnormal retroperitoneal stranding surrounding a focal dissection and possible intramural hematoma along a minimally aneurysmal segment of the infrarenal abdominal aorta. 3.3 x 2.8cm. 4/21 AAA repair  08/12/13: On precedex 1.2, esmolol and just got ativan 2mg : RASS +3 with all this   SUBJECTIVE/OVERNIGHT/INTERVAL HX 08/13/13: On dripivan and fentanyl. Got very agitated on WUA. Occ will follow commands. Febrile again. Not on pressors but on esmolol.   VITAL SIGNS: Temp:  [97.4 F (36.3 C)-101.5  F (38.6 C)] 97.5 F (36.4 C) (04/23 0732) Pulse Rate:  [65-103] 95 (04/23 0737) Resp:  [17-36] 20 (04/23 0737) BP: (97-175)/(51-104) 108/63 mmHg (04/23 0700) SpO2:  [86 %-100 %] 99 % (04/23 0737) FiO2 (%):  [40 %-100 %] 40 % (04/23 0737) HEMODYNAMICS:   VENTILATOR SETTINGS: Vent Mode:  [-] PRVC FiO2 (%):  [40 %-100 %] 40 % Set Rate:  [14 bmp-20 bmp] 20 bmp Vt Set:  [630 mL] 630 mL PEEP:  [5 cmH20-8 cmH20] 5 cmH20 Plateau Pressure:  [10 cmH20-24 cmH20] 18 cmH20 INTAKE / OUTPUT: Intake/Output     04/22 0701 - 04/23 0700 04/23 0701 - 04/24 0700   I.V. (mL/kg) 4864.8 (43.7)    NG/GT 150    IV Piggyback     Total Intake(mL/kg) 5014.8 (45)    Urine (mL/kg/hr) 2125 (0.8)    Emesis/NG output 600 (0.2)    Total Output 2725     Net +2289.8            PHYSICAL EXAMINATION: General:  Overweight male (245 lbs) anxious and combative in bed.  Neuro:  Alert, disoriented. Does not follow commands. Screaming phrases that do not make sense HEENT:  [place]/AT, PERRL Cardiovascular:  Tachy Lungs:  Clear anteriorly Abdomen:  Soft, non-distended Musculoskeletal:  No acute deformity/ROM limitations Skin:  Intact  LABS:  PULMONARY  Recent Labs Lab 08/11/13 1806 08/11/13 2334 08/12/13 1241  PHART 7.434  --  7.349*  PCO2ART 29.0*  --  38.7  PO2ART 54.0*  --  188.0*  HCO3 19.4*  --  21.2  TCO2 20  --  22  O2SAT 89.0 31.5 100.0    CBC  Recent Labs Lab 08/11/13 0258 08/11/13 1640 08/12/13 0359  HGB 11.9* 10.9* 10.4*  HCT 35.4* 32.4* 30.7*  WBC 10.6* 9.3 10.2  PLT 268 250 212    COAGULATION  Recent Labs Lab 08/10/13 0032 08/11/13 1640  INR 1.03 1.02    CARDIAC  No results found for this basename: TROPONINI,  in the last 168 hours No results found for this basename: PROBNP,  in the last 168 hours   CHEMISTRY  Recent Labs Lab 08/10/13 0032 08/11/13 0258 08/11/13 1640 08/12/13 0359 08/13/13 0425  NA 136* 138 135* 138 140  K 4.4 4.5 4.4 4.4 4.0  CL 101 103 101  104 107  CO2 20 24 20 22 19   GLUCOSE 105* 114* 150* 100* 77  BUN 13 22 18 17 17   CREATININE 0.85 1.00 0.92 1.04 0.89  CALCIUM 9.0 9.1 8.6 8.1* 8.0*  MG  --   --  1.9  --  2.1  PHOS  --   --   --   --  2.5   Estimated Creatinine Clearance: 122.7 ml/min (by C-G formula based on Cr of 0.89).   LIVER  Recent Labs Lab 08/09/13 1416 08/10/13 0032 08/11/13 1640  AST 44* 39*  --   ALT 84* 80*  --   ALKPHOS 103 129*  --   BILITOT 0.5 0.5  --   PROT 8.0 7.4  --   ALBUMIN 3.6 3.3*  --   INR  --  1.03 1.02     INFECTIOUS  Recent Labs Lab 08/12/13 0030 08/12/13 1130 08/13/13 0425  LATICACIDVEN 1.5  --   --   PROCALCITON  --  0.37 0.36     ENDOCRINE CBG (last 3)   Recent Labs  08/10/13 1124  GLUCAP 117*         IMAGING x48h  Dg Chest Port 1 View  08/13/2013   CLINICAL DATA:  Evaluate endotracheal tube.  Respiratory distress.  EXAM: PORTABLE CHEST - 1 VIEW  COMPARISON:  08/12/2013  FINDINGS: Endotracheal tube unchanged, at the level of the clavicles. Right internal jugular line unchanged. Nasogastric tube extends beyond the inferior aspect of the film. Normal heart size for level of inspiration. Possible small left pleural effusion. No pneumothorax. Improved moderate interstitial edema. Patchy bibasilar airspace disease. This is slightly increased.  IMPRESSION: Stable support apparatus.  Moderate congestive heart failure, improved.  Developing bibasilar airspace disease.  Favor atelectasis.   Electronically Signed   By: Jeronimo GreavesKyle  Talbot M.D.   On: 08/13/2013 08:16   Dg Chest Port 1 View  08/12/2013   CLINICAL DATA:  Endotracheal tube placement  EXAM: PORTABLE CHEST - 1 VIEW  COMPARISON:  08/12/2013  FINDINGS: Cardiomediastinal silhouette is stable. Endotracheal tube in place with tip 5.6 cm above the carina. Stable right IJ central line position. Persistent congestive changes and pulmonary edema. No pneumothorax.  IMPRESSION: Endotracheal tube in place with tip 5.6 cm above  the carina. Stable right IJ central line position. Persistent congestive changes and pulmonary edema. No pneumothorax.   Electronically Signed   By: Natasha MeadLiviu  Pop M.D.   On: 08/12/2013 11:48   Dg Chest Port 1 View  08/12/2013   CLINICAL DATA:  Respiratory failure.  EXAM: PORTABLE CHEST - 1 VIEW  COMPARISON:  DG ABD ACUTE W/CHEST dated 08/09/2013  FINDINGS: Right IJ line in good anatomic position. Cardiomegaly. Diffuse prominent bilateral alveolar infiltrates noted  throughout both lungs. These findings are consistent with pulmonary edema. Bilateral pneumonia cannot be excluded. No pleural effusion or pneumothorax.  IMPRESSION: 1. Right IJ line in good anatomic position. 2. Congestive heart failure and bilateral pulmonary edema. Other etiologies of bilateral pulmonary infiltrates including pneumonia cannot be excluded.   Electronically Signed   By: Maisie Fushomas  Register   On: 08/12/2013 08:19      ASSESSMENT / PLAN:  NEUROLOGIC A:   Delirium - could be related to ETOH withdrawal as he has been reported a daily drinker by his partner. He has been calling out from the room "take me to the bar, I'll sober up there", so withdrawal remains a likley etiology.    - severe delirium persists. 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  HTN  - s/p nipride for 12h  - now on esmolol and weaning off  P:  Management per vascular surgery -Currently on esmolol drip, prn labetalol, prn hydralazine for BP control.   Close monitoring of BP with increased sedation needs.   PULMONARY A: VDRF since 08/13/13 due to acute encephalopath   - does not meet sbt criteria due to acute enceph P:   Full vent support  GI A: Intubated  P NPO Start tube feeds   RENAL A: Normal P Monitor  HEME A: Monitor P  ID A: spiking fevers, TRacheal aspirate and cxr suggests likely pneumonia P Pan culture borad  abx     STAFF GLOBAL NOTE significan other Roko updated on 08/12/13  08/13/13: Dw/ Dr Myra GianottiBrabham at bedside   The patient is critically ill with multiple organ systems failure and requires high complexity decision making for assessment and support, frequent evaluation and titration of therapies, application of advanced monitoring technologies and extensive interpretation of multiple databases.   Critical Care Time devoted to patient care services described in this note is  35  Minutes.  Dr. Kalman ShanMurali Yenni Carra, M.D., [facility]C.P Pulmonary and Critical Care Medicine Staff Physician [facility] [place] Pulmonary and Critical Care Pager: [phone], If no answer or between  15:00h - 7:00h: call [phone]  08/13/2013 8:32 AM

## 2013-08-13 NOTE — Progress Notes (Signed)
eLINK RN notified pt afib vs SR/ST with PAC, HR 120s at times, not sustained. Remains on esmolol gtt at 30 mcg. eLINK RN to notify MD, will continue to monitor. Koren BoundJennifer Arrion Burruel

## 2013-08-13 NOTE — Progress Notes (Addendum)
ANTIBIOTIC CONSULT NOTE - INITIAL  Pharmacy Consult for vancomycin, zosyn Indication: sepsis  Allergies  Allergen Reactions  . Antihistamines, Chlorpheniramine-Type Anaphylaxis    All anithistamines   . Benzonatate Other (See Comments)    tounge swelling   . Other Itching    Darvocet  . Penicillins Nausea And Vomiting    Patient Measurements: Height: 6\' 1"  (185.4 cm) Weight: 245 lb 9.5 oz (111.4 kg) IBW/kg (Calculated) : 79.9  Vital Signs: Temp: 97.5 F (36.4 C) (04/23 0732) Temp src: Oral (04/23 0732) BP: 108/63 mmHg (04/23 0700) Pulse Rate: 95 (04/23 0737) Intake/Output from previous day: 04/22 0701 - 04/23 0700 In: 5014.8 [I.V.:4864.8; NG/GT:150] Out: 2725 [Urine:2125; Emesis/NG output:600] Intake/Output from this shift:    Labs:  Recent Labs  08/11/13 0258 08/11/13 1640 08/12/13 0359 08/13/13 0425  WBC 10.6* 9.3 10.2  --   HGB 11.9* 10.9* 10.4*  --   PLT 268 250 212  --   CREATININE 1.00 0.92 1.04 0.89   Estimated Creatinine Clearance: 122.7 ml/min (by C-G formula based on Cr of 0.89). No results found for this basename: VANCOTROUGH, VANCOPEAK, VANCORANDOM, GENTTROUGH, GENTPEAK, GENTRANDOM, TOBRATROUGH, TOBRAPEAK, TOBRARND, AMIKACINPEAK, AMIKACINTROU, AMIKACIN,  in the last 72 hours    Medical History: Past Medical History  Diagnosis Date  . GERD (gastroesophageal reflux disease)   . Diabetes     possibly early stage, not formally diagnosed- MD wanted to start on metformin- is not taking per patient   . H/O hiatal hernia     repaired    Medications:  Prescriptions prior to admission  Medication Sig Dispense Refill  . aspirin 81 MG tablet Take 81 mg by mouth daily.      . Coenzyme Q10 (CO Q 10 PO) Take 1 capsule by mouth daily.      . CRESTOR 5 MG tablet Take 5 mg by mouth daily.       . cyclobenzaprine (FLEXERIL) 10 MG tablet Take 10 mg by mouth 3 (three) times daily as needed for muscle spasms.       Marland Kitchen. ibuprofen (ADVIL,MOTRIN) 800 MG tablet  Take 800 mg by mouth 4 (four) times daily.       . lansoprazole (PREVACID) 30 MG capsule Take 30 mg by mouth daily.       Marland Kitchen. LORazepam (ATIVAN) 1 MG tablet Take 1 mg by mouth 2 (two) times daily as needed.       . mometasone (NASONEX) 50 MCG/ACT nasal spray Place 2 sprays into the nose daily as needed (allergies).      . Multiple Vitamins-Minerals (MULTIVITAMIN WITH MINERALS) tablet Take 1 tablet by mouth daily.      . Probiotic Product (ALIGN) 4 MG CAPS Take 4 mg by mouth daily.      . traMADol (ULTRAM) 50 MG tablet Take 50 mg by mouth every 4 (four) hours.        Scheduled:  . antiseptic oral rinse  15 mL Mouth Rinse QID  . aspirin  81 mg Per Tube Daily  . atorvastatin  10 mg Oral q1800  . chlorhexidine  15 mL Mouth Rinse BID  . fluticasone  1 spray Each Nare Daily  . folic acid  1 mg Oral Daily   Or  . folic acid  1 mg Intravenous Daily  . furosemide  20 mg Intravenous Once  . heparin subcutaneous  5,000 Units Subcutaneous Q12H  . LORazepam  1 mg Intravenous Once  . multivitamin with minerals  1 tablet Oral Daily  .  pantoprazole sodium  40 mg Per Tube Q1200  . thiamine  100 mg Oral Daily   Or  . thiamine IV  100 mg Intravenous Daily     Assessment: 58 yo male with VDRF due to acute encephalopathy now noted with fever and CXR suggesting PNA. Pharmacy has been asked to dose vancomycin and zosyn.  WBC= 10.2, tmax= 101.5, PCT= 0.36, SCr= 0.89 and CrCl > 100.  Patient was on vancomycin s/p AAA repair 4/21. Last dose of vancomycin was 1000mg  on 4/22 at 0500 (dosing regimen was 1000mg  IV q12h)  4/21 vanc>>422 4/22> vanc   4/23 resp 4/23 urine 4/22 blood x2 4/19 blood x2- ngtd  Goal of Therapy:  Vancomycin trough level 15-20 mcg/ml  Plan:  --Will dose vancomycin as 1500mg  IV x1 followed by 1250mg  IV q8h (due to age and weight) -Zosyn 3.375gm IV q8h -Will follow renal function, cultures and clinical progress  Harland Germanndrew Jessika Rothery, Pharm D 08/13/2013 9:13 AM

## 2013-08-14 ENCOUNTER — Inpatient Hospital Stay (HOSPITAL_COMMUNITY): Payer: BC Managed Care – PPO

## 2013-08-14 DIAGNOSIS — I517 Cardiomegaly: Secondary | ICD-10-CM

## 2013-08-14 LAB — PROCALCITONIN: PROCALCITONIN: 0.36 ng/mL

## 2013-08-14 LAB — CBC WITH DIFFERENTIAL/PLATELET
BASOS ABS: 0 10*3/uL (ref 0.0–0.1)
BASOS PCT: 0 % (ref 0–1)
Eosinophils Absolute: 0.4 10*3/uL (ref 0.0–0.7)
Eosinophils Relative: 4 % (ref 0–5)
HCT: 28.2 % — ABNORMAL LOW (ref 39.0–52.0)
HEMOGLOBIN: 9.7 g/dL — AB (ref 13.0–17.0)
Lymphocytes Relative: 14 % (ref 12–46)
Lymphs Abs: 1.4 10*3/uL (ref 0.7–4.0)
MCH: 31.8 pg (ref 26.0–34.0)
MCHC: 34.4 g/dL (ref 30.0–36.0)
MCV: 92.5 fL (ref 78.0–100.0)
MONOS PCT: 8 % (ref 3–12)
Monocytes Absolute: 0.7 10*3/uL (ref 0.1–1.0)
NEUTROS ABS: 7.1 10*3/uL (ref 1.7–7.7)
Neutrophils Relative %: 74 % (ref 43–77)
Platelets: 250 10*3/uL (ref 150–400)
RBC: 3.05 MIL/uL — ABNORMAL LOW (ref 4.22–5.81)
RDW: 11.8 % (ref 11.5–15.5)
WBC: 9.6 10*3/uL (ref 4.0–10.5)

## 2013-08-14 LAB — GLUCOSE, CAPILLARY
GLUCOSE-CAPILLARY: 87 mg/dL (ref 70–99)
GLUCOSE-CAPILLARY: 125 mg/dL — AB (ref 70–99)
Glucose-Capillary: 82 mg/dL (ref 70–99)
Glucose-Capillary: 104 mg/dL — ABNORMAL HIGH (ref 70–99)
Glucose-Capillary: 101 mg/dL — ABNORMAL HIGH (ref 70–99)

## 2013-08-14 LAB — MAGNESIUM: MAGNESIUM: 1.9 mg/dL (ref 1.5–2.5)

## 2013-08-14 LAB — BASIC METABOLIC PANEL
BUN: 16 mg/dL (ref 6–23)
CO2: 22 mEq/L (ref 19–32)
Calcium: 8.1 mg/dL — ABNORMAL LOW (ref 8.4–10.5)
Chloride: 101 mEq/L (ref 96–112)
Creatinine, Ser: 0.84 mg/dL (ref 0.50–1.35)
Glucose, Bld: 98 mg/dL (ref 70–99)
POTASSIUM: 3.1 meq/L — AB (ref 3.7–5.3)
Sodium: 136 mEq/L — ABNORMAL LOW (ref 137–147)

## 2013-08-14 LAB — URINE CULTURE
COLONY COUNT: NO GROWTH
Culture: NO GROWTH

## 2013-08-14 LAB — TROPONIN I: Troponin I: <0.3 ng/mL (ref <0.30)

## 2013-08-14 LAB — POTASSIUM: POTASSIUM: 3.2 meq/L — AB (ref 3.7–5.3)

## 2013-08-14 LAB — PHOSPHORUS: PHOSPHORUS: 3.4 mg/dL (ref 2.3–4.6)

## 2013-08-14 LAB — LACTIC ACID, PLASMA: Lactic Acid, Venous: 0.9 mmol/L (ref 0.5–2.2)

## 2013-08-14 LAB — PRO B NATRIURETIC PEPTIDE: PRO B NATRI PEPTIDE: 1092 pg/mL — AB (ref 0–125)

## 2013-08-14 MED ORDER — SORBITOL 70 % SOLN
960.0000 mL | TOPICAL_OIL | Freq: Once | ORAL | Status: AC
  Administered 2013-08-14: 960 mL via RECTAL
  Filled 2013-08-14: qty 240

## 2013-08-14 MED ORDER — FUROSEMIDE 10 MG/ML IJ SOLN
20.0000 mg | Freq: Once | INTRAMUSCULAR | Status: AC
  Administered 2013-08-14: 20 mg via INTRAVENOUS
  Filled 2013-08-14: qty 2

## 2013-08-14 MED ORDER — MAGNESIUM SULFATE 40 MG/ML IJ SOLN
2.0000 g | Freq: Once | INTRAMUSCULAR | Status: AC
  Administered 2013-08-14: 2 g via INTRAVENOUS
  Filled 2013-08-14: qty 50

## 2013-08-14 MED ORDER — LEVOFLOXACIN IN D5W 750 MG/150ML IV SOLN
750.0000 mg | INTRAVENOUS | Status: DC
  Administered 2013-08-14: 750 mg via INTRAVENOUS
  Filled 2013-08-14 (×2): qty 150

## 2013-08-14 MED ORDER — ESMOLOL HCL-SODIUM CHLORIDE 2000 MG/100ML IV SOLN
25.0000 ug/kg/min | INTRAVENOUS | Status: DC
  Administered 2013-08-14: 45 ug/kg/min via INTRAVENOUS
  Administered 2013-08-15: 75 ug/kg/min via INTRAVENOUS
  Administered 2013-08-15: 40 ug/kg/min via INTRAVENOUS
  Filled 2013-08-14 (×3): qty 100

## 2013-08-14 MED ORDER — PROPOFOL 10 MG/ML IV EMUL
5.0000 ug/kg/min | INTRAVENOUS | Status: DC
  Administered 2013-08-14: 37 ug/kg/min via INTRAVENOUS
  Administered 2013-08-14: 40 ug/kg/min via INTRAVENOUS
  Administered 2013-08-15 (×3): 50 ug/kg/min via INTRAVENOUS
  Filled 2013-08-14 (×5): qty 100

## 2013-08-14 MED ORDER — POTASSIUM CHLORIDE 10 MEQ/50ML IV SOLN
10.0000 meq | INTRAVENOUS | Status: AC
  Administered 2013-08-14 (×6): 10 meq via INTRAVENOUS
  Filled 2013-08-14 (×5): qty 50

## 2013-08-14 MED ORDER — POTASSIUM CHLORIDE 20 MEQ/15ML (10%) PO LIQD
40.0000 meq | ORAL | Status: AC
  Administered 2013-08-14 – 2013-08-15 (×2): 40 meq
  Filled 2013-08-14 (×3): qty 30

## 2013-08-14 MED ORDER — ENOXAPARIN SODIUM 40 MG/0.4ML ~~LOC~~ SOLN
40.0000 mg | SUBCUTANEOUS | Status: DC
  Administered 2013-08-14 – 2013-08-19 (×6): 40 mg via SUBCUTANEOUS
  Filled 2013-08-14 (×6): qty 0.4

## 2013-08-14 MED ORDER — POTASSIUM CHLORIDE 10 MEQ/50ML IV SOLN
INTRAVENOUS | Status: AC
  Filled 2013-08-14: qty 50

## 2013-08-14 NOTE — Progress Notes (Addendum)
Appears to have drug rash in supra-pubic area - new today  Plan Dc vanc Dc zosyn Start levaquin (growing GNR in sputum) Narrow as cultures come    Dr. Kalman ShanMurali Garrie Elenes, M.D., [facility].C.C.P Pulmonary and Critical Care Medicine Staff Physician [facility] Pulmonary and Critical Care Pager: [phone], If no answer or between  15:00h - 7:00h: call [phone]  08/14/2013 1:31 PM    Results for orders placed during the hospital encounter of 08/09/13  CULTURE, BLOOD (ROUTINE X 2)     Status: None   Collection Time    08/09/13  7:34 PM      Result Value Ref Range Status   Specimen Description BLOOD LEFT ANTECUBITAL   Final   Special Requests     Final   Value: BOTTLES DRAWN AEROBIC AND ANAEROBIC 10CC BLUE 5CC RED   Culture  Setup Time     Final   Value: 08/10/2013 02:50     Performed at [facility]   Culture     Final   Value:        BLOOD CULTURE RECEIVED NO GROWTH TO DATE CULTURE WILL BE HELD FOR 5 DAYS BEFORE ISSUING A FINAL NEGATIVE REPORT     Performed at [facility]   Report Status PENDING   Incomplete  CULTURE, BLOOD (ROUTINE X 2)     Status: None   Collection Time    08/09/13  7:45 PM      Result Value Ref Range Status   Specimen Description BLOOD LEFT HAND   Final   Special Requests BOTTLES DRAWN AEROBIC AND ANAEROBIC 5CC EA   Final   Culture  Setup Time     Final   Value: 08/10/2013 02:50     Performed at [facility]   Culture     Final   Value:        BLOOD CULTURE RECEIVED NO GROWTH TO DATE CULTURE WILL BE HELD FOR 5 DAYS BEFORE ISSUING A FINAL NEGATIVE REPORT     Performed at [facility]   Report Status PENDING   Incomplete  MRSA PCR SCREENING     Status: None   Collection Time    08/09/13  9:49 PM      Result Value Ref Range Status   MRSA by PCR NEGATIVE  NEGATIVE Final   Comment:            The GeneXpert MRSA Assay (FDA     approved for NASAL specimens     only), is one component of a   comprehensive MRSA colonization     surveillance program. It is not     intended to diagnose MRSA     infection nor to guide or     monitor treatment for     MRSA infections.  CULTURE, BLOOD (ROUTINE X 2)     Status: None   Collection Time    08/12/13  2:05 AM      Result Value Ref Range Status   Specimen Description BLOOD RIGHT HAND   Final   Special Requests BOTTLES DRAWN AEROBIC ONLY 3CC   Final   Culture  Setup Time     Final   Value: 08/12/2013 08:26     Performed at [facility]   Culture     Final   Value:        BLOOD CULTURE RECEIVED NO GROWTH TO DATE CULTURE WILL BE HELD FOR 5 DAYS  BEFORE ISSUING A FINAL NEGATIVE REPORT     Performed at [facility]   Report Status PENDING   Incomplete  CULTURE, BLOOD (ROUTINE X 2)     Status: None   Collection Time    08/12/13  2:11 AM      Result Value Ref Range Status   Specimen Description BLOOD LEFT HAND   Final   Special Requests BOTTLES DRAWN AEROBIC ONLY 4CC   Final   Culture  Setup Time     Final   Value: 08/12/2013 08:27     Performed at [facility]   Culture     Final   Value:        BLOOD CULTURE RECEIVED NO GROWTH TO DATE CULTURE WILL BE HELD FOR 5 DAYS BEFORE ISSUING A FINAL NEGATIVE REPORT     Performed at [facility]   Report Status PENDING   Incomplete  CULTURE, RESPIRATORY (NON-EXPECTORATED)     Status: None   Collection Time    08/13/13  8:57 AM      Result Value Ref Range Status   Specimen Description TRACHEAL ASPIRATE   Final   Special Requests NONE   Final   Gram Stain     Final   Value: MODERATE WBC PRESENT,BOTH PMN AND MONONUCLEAR     RARE SQUAMOUS EPITHELIAL CELLS PRESENT     RARE GRAM VARIABLE ROD     Performed at [facility]   Culture     Final   Value: MODERATE GRAM NEGATIVE RODS     Performed at [facility]   Report Status PENDING   Incomplete  URINE CULTURE     Status: None   Collection Time    08/13/13  9:28 AM      Result Value Ref Range  Status   Specimen Description URINE, CATHETERIZED   Final   Special Requests NONE   Final   Culture  Setup Time     Final   Value: 08/13/2013 10:36     [facility]formed at [facility]   Colony Count     Final   Value: NO GROWTH     Performed at [facility]   Culture     Final   Value: NO GROWTH     Performed at [facility]   Report Status 08/14/2013 FINAL   Final

## 2013-08-14 NOTE — Progress Notes (Signed)
Dr Marchelle Gearingamaswamy updated pt with rash to lower abdomen and top of legs. This is the first time RN has assessed rash to patient. MD updated IV vanc and zosyn started yesterday, MD to bedside to assess rash. Will continue to monitor. Koren BoundJennifer Add Dinapoli

## 2013-08-14 NOTE — Progress Notes (Signed)
  Echocardiogram 2D Echocardiogram has been performed.  Real ConsChristy F Acire Tang 08/14/2013, 3:23 PM

## 2013-08-14 NOTE — Progress Notes (Signed)
Dr Marchelle Gearingamaswamy updated at bedside. Updated pt remains on diprivan and fentanyl gtts. Propofol at 40mcg and fentanyl at 75 mcg. Pt receiving PRN ativan as well. Pt RASS range -2 to +2. Pt will open eyes to speech, not sustained, no following commands, responds to painful stimuli. MD stated no weaning on vent today. Continue current IV sedation. No BM, enema ordered. MD updated family at bedside. Will continue to monitor. Koren BoundJennifer Joanie Duprey

## 2013-08-14 NOTE — Progress Notes (Signed)
ANTIBIOTIC CONSULT NOTE - FOLLOW UP  Pharmacy Consult for levaquin Indication: PNA  Allergies  Allergen Reactions  . Antihistamines, Chlorpheniramine-Type Anaphylaxis    All anithistamines   . Benzonatate Other (See Comments)    tounge swelling   . Other Itching    Darvocet  . Penicillins Nausea And Vomiting    Patient Measurements: Height: 6\' 1"  (185.4 cm) Weight: 216 lb 0.8 oz (98 kg) IBW/kg (Calculated) : 79.9  Vital Signs: Temp: 98.6 F (37 C) (04/24 1211) Temp src: Oral (04/24 1211) BP: 118/67 mmHg (04/24 1345) Pulse Rate: 84 (04/24 1345) Intake/Output from previous day: 04/23 0701 - 04/24 0700 In: 3880.2 [I.V.:1840.2; NG/GT:890; IV Piggyback:1150] Out: 5140 [Urine:4690; Emesis/NG output:450] Intake/Output from this shift: Total I/O In: 1110.2 [I.V.:400.2; NG/GT:160; IV Piggyback:550] Out: 1425 [Urine:1425]  Labs:  Recent Labs  08/12/13 0359 08/13/13 0425 08/13/13 0922 08/14/13 0245  WBC 10.2  --  10.0 9.6  HGB 10.4*  --  9.6* 9.7*  PLT 212  --  222 250  CREATININE 1.04 0.89  --  0.84   Estimated Creatinine Clearance: 122.4 ml/min (by C-G formula based on Cr of 0.84). No results found for this basename: VANCOTROUGH, VANCOPEAK, VANCORANDOM, GENTTROUGH, GENTPEAK, GENTRANDOM, TOBRATROUGH, TOBRAPEAK, TOBRARND, AMIKACINPEAK, AMIKACINTROU, AMIKACIN,  in the last 72 hours    Assessment: 58 yo male with PNA on vancomycin and zosyn and to change to levaquin. Patient noted with GNR is respiratory cultures. WBC= 9.6, tmax= 101.7 (currently afebrile), SCr= 0.84 and CrCl >100.  4/21 vanc>>4/22 (post op) 4/23 vanc>> 4/24 4/23 zosyn>> 4/24 4/24 levaquin  4/23 resp- GNR 4/23 urine- neg 4/22 blood x2- ngtd 4/19 blood x2- ngtd  Plan:  -Levaquin 750mg  IV q24h -Will follow renal function, cultures and clinical progress  Harland Germanndrew Lucina Betty, Pharm D 08/14/2013 3:01 PM

## 2013-08-14 NOTE — Progress Notes (Signed)
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 His partner reports that he has been a daily drinker for some time, and has had experiences like this before. Also states, however that he may not have had a drink for 2 weeks. He presented to the ER 4/19 with approximately 1 week of abdominal and back pain. He had been seen bby his PCP and treated with muscle relaxers and narcotics without relief. He reported no aggravating or relieving factors. 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 He had surgical repair of the aneurysm 4/21. Postoperatively he was combative with staff and disoriented. He was started on precedex infusion with some improvement. 4/21 PM he remains agitated and screaming at staff, trying to climb out of bed. PCCM asked to see for help managing delirium.    LINES / TUBES: Left Radial Art Line 4/21 (accuracy uncertain)>>>? date RIJ CVL 4/21 >>> Foley 4/21 >>> ETT 4/22 >> OG tube 4/22 >>  CULTURES: Blood 4/19 >>> .Marland Kitchen. Trach aspirate 4/23 Urine 4/23 >>  ANTIBIOTICS: Vanc 4/19 >>>    SIGNIFICANT EVENTS / STUDIES:  4/21 CT abodmoen >>> Abnormal retroperitoneal stranding surrounding a focal dissection and possible intramural hematoma along a minimally aneurysmal segment of the infrarenal abdominal aorta. 3.3 x 2.8cm. 4/21 AAA repair  08/12/13: On precedex 1.2, esmolol and just got ativan 2mg : RASS +3 with all this  08/13/13: On 30mcg dripivan and 150mcg fentanyl. Got very agitated on WUA. Occ will follow commands. Febrile again. Not on pressors but on esmolol.    SUBJECTIVE/OVERNIGHT/INTERVAL HX 08/14/13: Overall strategy of weaning fentanyl  but continuing diprivan with ativan prn seems working well but stimulation x 2 has resulted in significant agitation. On Tube feeds now. Stil constipated; enema pending. Still on esmolol for sBP  < 130/MAP < 70. Still trying to go in and out of A Fib. CXR  Bilateral airspace disease ? CHF NOS v ALI; on 40% fio2   VITAL SIGNS: Temp:  [97.9 F (36.6 C)-101.7 F (38.7 C)] 98.9 F (37.2 C) (04/24 0741) Pulse Rate:  [43-116] 78 (04/24 0945) Resp:  [17-31] 20 (04/24 0945) BP: (94-164)/(54-125) 108/59 mmHg (04/24 0945) SpO2:  [90 %-100 %] 100 % (04/24 0945) FiO2 (%):  [40 %] 40 % (04/24 0857) Weight:  [98 kg (216 lb 0.8 oz)] 98 kg (216 lb 0.8 oz) (04/24 0600) HEMODYNAMICS:   VENTILATOR SETTINGS: Vent Mode:  [-] PRVC FiO2 (%):  [40 %] 40 % Set Rate:  [20 bmp] 20 bmp Vt Set:  [630 mL] 630 mL PEEP:  [5 cmH20] 5 cmH20 Plateau Pressure:  [16 cmH20-23 cmH20] 18 cmH20 INTAKE / OUTPUT: Intake/Output     04/23 0701 - 04/24 0700 04/24 0701 - 04/25 0700   I.V. (mL/kg) 1840.2 (18.8) 128.5 (1.3)   NG/GT 890 130   IV Piggyback 1150 100   Total Intake(mL/kg) 3880.2 (39.6) 358.5 (3.7)   Urine (mL/kg/hr) 4690 (2) 300 (0.9)   Emesis/NG output 450 (0.2)    Total Output 5140 300   Net -1259.8 +58.5          PHYSICAL EXAMINATION: General:  Overweight male (245 lbs) anxious and combative in bed.  Neuro:  Alert, disoriented. Does not follow commands. Screaming phrases that do not make sense HEENT:  [place]/AT, PERRL Cardiovascular:  Tachy Lungs:  Clear anteriorly Abdomen:  Soft, non-distended Musculoskeletal:  No acute deformity/ROM limitations Skin:  Intact  LABS:  PULMONARY  Recent Labs Lab 08/11/13 1806 08/11/13 2334 08/12/13 1241  PHART 7.434  --  7.349*  PCO2ART 29.0*  --  38.7  PO2ART 54.0*  --  188.0*  HCO3 19.4*  --  21.2  TCO2 20  --  22  O2SAT 89.0 31.5 100.0    CBC  Recent Labs Lab 08/12/13 0359 08/13/13 0922 08/14/13 0245  HGB 10.4* 9.6* 9.7*  HCT 30.7* 28.9* 28.2*   WBC 10.2 10.0 9.6  PLT 212 222 250    COAGULATION  Recent Labs Lab 08/10/13 0032 08/11/13 1640  INR 1.03 1.02    CARDIAC    Recent Labs Lab 08/13/13 0922 08/13/13 1654 08/14/13 0245  TROPONINI <0.30 <0.30 <0.30    Recent Labs Lab 08/14/13 0245  PROBNP 1092.0*     CHEMISTRY  Recent Labs Lab 08/11/13 0258 08/11/13 1640 08/12/13 0359 08/13/13 0425 08/14/13 0245  NA 138 135* 138 140 136*  K 4.5 4.4 4.4 4.0 3.1*  CL 103 101 104 107 101  CO2 24 20 22 19 22   GLUCOSE 114* 150* 100* 77 98  BUN 22 18 17 17 16   CREATININE 1.00 0.92 1.04 0.89 0.84  CALCIUM 9.1 8.6 8.1* 8.0* 8.1*  MG  --  1.9  --  2.1 1.9  PHOS  --   --   --  2.5 3.4   Estimated Creatinine Clearance: 122.4 ml/min (by C-G formula based on Cr of 0.84).   LIVER  Recent Labs Lab 08/09/13 1416 08/10/13 0032 08/11/13 1640  AST 44* 39*  --   ALT 84* 80*  --   ALKPHOS 103 129*  --   BILITOT 0.5 0.5  --   PROT 8.0 7.4  --   ALBUMIN 3.6 3.3*  --   INR  --  1.03 1.02     INFECTIOUS  Recent Labs Lab 08/12/13 0030 08/12/13 1130 08/13/13 0425 08/14/13 0245  LATICACIDVEN 1.5  --   --  0.9  PROCALCITON  --  0.37 0.36 0.36     ENDOCRINE CBG (last 3)   Recent Labs  08/13/13 2345 08/14/13 0413 08/14/13 0743  GLUCAP 109* 82 87         IMAGING x48h  Dg Chest Port 1 View  08/14/2013   CLINICAL DATA:  Abdominal aortic aneurysm stent graft  EXAM: PORTABLE CHEST - 1 VIEW  COMPARISON:  08/13/2013  FINDINGS: Endotracheal tube in good position. Right jugular catheter tip in the SVC. NG tube in place and unchanged.  Progression of bilateral pulmonary edema. Progression of bibasilar atelectasis and effusion.  IMPRESSION: Progression of congestive heart failure with edema and bilateral effusions.   Electronically Signed   By: Marlan Palauharles  Clark M.D.   On: 08/14/2013 07:50   Dg Chest Port 1 View  08/13/2013   ADDENDUM REPORT: 08/13/2013 12:24  ADDENDUM: There is NG tube in place.    Electronically Signed   By: Natasha MeadLiviu  Pop M.D.   On: 08/13/2013 12:24   08/13/2013   CLINICAL DATA:  Endotracheal tube placement  EXAM: PORTABLE CHEST - 1 VIEW  COMPARISON:  08/12/2013  FINDINGS: Cardiomediastinal silhouette is stable. Endotracheal tube in place with tip 5.6 cm above the carina. Stable right IJ central line position. Persistent congestive changes and pulmonary  edema. No pneumothorax.  IMPRESSION: Endotracheal tube in place with tip 5.6 cm above the carina. Stable right IJ central line position. Persistent congestive changes and pulmonary edema. No pneumothorax.  Electronically Signed: By: Natasha Mead M.D. On: 08/12/2013 11:48   Dg Chest Port 1 View  08/13/2013   CLINICAL DATA:  Evaluate endotracheal tube.  Respiratory distress.  EXAM: PORTABLE CHEST - 1 VIEW  COMPARISON:  08/12/2013  FINDINGS: Endotracheal tube unchanged, at the level of the clavicles. Right internal jugular line unchanged. Nasogastric tube extends beyond the inferior aspect of the film. Normal heart size for level of inspiration. Possible small left pleural effusion. No pneumothorax. Improved moderate interstitial edema. Patchy bibasilar airspace disease. This is slightly increased.  IMPRESSION: Stable support apparatus.  Moderate congestive heart failure, improved.  Developing bibasilar airspace disease.  Favor atelectasis.   Electronically Signed   By: Jeronimo Greaves M.D.   On: 08/13/2013 08:16      ASSESSMENT / PLAN:  NEUROLOGIC A:   Delirium - could be related to ETOH withdrawal as he has been reported a daily drinker by his partner. He has been calling out from the room "take me to the bar, I'll sober up there", so withdrawal remains a likley etiology.    - severe delirium persists.  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  HTN  - s/p nipride for 12h  - now on esmolol and weaning off. In NSR but when agitated apparently in A  Fib. CXR looks wet  P:  Get ECHO - rule out CHF systolic v diastolic Management per vascular surgery -Currently on esmolol drip, prn labetalol, prn hydralazine for BP control. - d/w VVS on 08/15/13 and consider dc esmolol Close monitoring of BP with increased sedation needs.   PULMONARY A: VDRF since 08/13/13 due to acute encephalopath   - does not meet sbt criteria due to acute enceph. Concern for ALI P:   Full vent support Awiat echo result to determine if ALI  GI A: Intubated  P NPO tube feeds   RENAL A: Normal. Mag and K less P Monitor Replete mag  HEME A: Anemia of critical illness P PRBC for hgb < 7gm%  ID A: spiking fevers, TRacheal aspirate and cxr suggests likely pneumonia P Pan culture -awaited broad abx     STAFF GLOBAL NOTE significan other Roko updated on 08/12/13  08/13/13: Dw/ Dr Myra Gianotti at bedside   08/14/13: sister oveida at bedside and updated   The patient is critically ill with multiple organ systems failure and requires high complexity decision making for assessment and support, frequent evaluation and titration of therapies, application of advanced monitoring technologies and extensive interpretation of multiple databases.   Critical Care Time devoted to patient care services described in this note is  35  Minutes.  Dr. Kalman Shan, M.D., [facility].C.P Pulmonary and Critical Care Medicine Staff Physician [place] [facility] Portal Pulmonary and Critical Care Pager: [phone], If no answer or between  15:00h - 7:00h: call [phone]  08/14/2013 10:16 AM

## 2013-08-14 NOTE — Progress Notes (Addendum)
[facility] [place]  Subjective  - On ventilator and sedated.   Objective 112/76 83 98.9 F (37.2 C) (Oral) 20 100%  Intake/Output Summary (Last 24 hours) at 08/14/13 0907 Last data filed at 08/14/13 0800  Gross per 24 hour  Intake 3676.23 ml  Output   4840 ml  Net -1163.77 ml    Doppler signals Dp 2+ bil. Groin incision C/D without hematoma   Assessment/Planning: POD #3 Procedure: #1: Endovascular repair of abdominal aortic aneurysm  #2: Catheter in aorta x2  #3: Abdominal aortogram  #4: Bilateral ultrasound guided common femoral artery access   A fib heparin SQ may anticoagulate as needed at this point from vascular stand point.  Hypokalemia potassium ordered Urine output 450 last 24 hours, PBN elevated will dose lasix again Intubation per CCM   Walter Morgan 08/14/2013 9:07 AM --  Laboratory Lab Results:  Recent Labs  08/13/13 0922 08/14/13 0245  WBC 10.0 9.6  HGB 9.6* 9.7*  HCT 28.9* 28.2*  PLT 222 250   BMET  Recent Labs  08/13/13 0425 08/14/13 0245  NA 140 136*  K 4.0 3.1*  CL 107 101  CO2 19 22  GLUCOSE 77 98  BUN 17 16  CREATININE 0.89 0.84  CALCIUM 8.0* 8.1*    COAG Lab Results  Component Value Date   INR 1.02 08/11/2013   INR 1.03 08/10/2013   No results found for this basename: PTT

## 2013-08-14 NOTE — Progress Notes (Signed)
eLink Nursing ICU Electrolyte Replacement Protocol  Patient Name: Walter Morgan DOB: 1955-09-16 MRN: [id number]  Date of Service  08/14/2013   HPI/Events of Note    Recent Labs Lab 08/11/13 0258 08/11/13 1640 08/12/13 0359 08/13/13 0425 08/14/13 0245  NA 138 135* 138 140 136*  K 4.5 4.4 4.4 4.0 3.1*  CL 103 101 104 107 101  CO2 24 20 22 19 22   GLUCOSE 114* 150* 100* 77 98  BUN 22 18 17 17 16   CREATININE 1.00 0.92 1.04 0.89 0.84  CALCIUM 9.1 8.6 8.1* 8.0* 8.1*  MG  --  1.9  --  2.1 1.9  PHOS  --   --   --  2.5 3.4    Estimated Creatinine Clearance: 130 ml/min (by C-G formula based on Cr of 0.84).  Intake/Output     04/23 0701 - 04/24 0700   I.V. (mL/kg) 1491.4 (13.4)   NG/GT 690   IV Piggyback 1150   Total Intake(mL/kg) 3331.4 (29.9)   Urine (mL/kg/hr) 4455 (1.7)   Emesis/NG output 450 (0.2)   Total Output 4905   Net -1573.6        - I/O DETAILED x24h    Total I/O In: 1407.2 [I.V.:662.2; NG/GT:420; IV Piggyback:325] Out: 805 [Urine:805] - I/O THIS SHIFT    ASSESSMENT   eICURN Interventions  K+ 3.1 Not Replaced. Pt does not fit ICU elctrolyte replacement protocol criteria. K+ in IVF  MD notified    ASSESSMENT: MAJOR ELECTROLYTE    Merita NortonSherrie Nicole Jannie Doyle 08/14/2013, 5:36 AM

## 2013-08-14 NOTE — Progress Notes (Signed)
Subjective  - POD #4  Remains combative when sedation is decreased   Physical Exam:  Pt responds to questioning and denies abdominal pain   Pt responds to questioning and denies abdominal pain  His abdomen is soft as are his access sites in each groin. Both feet are warm with doppler signals       Assessment/Plan:  POD #4  s/p EVAR for PAU / inflamatory aneurysm.    ID: Patient spiked a high fever on POD#1 and has been intermittently febrile. Source unknown. Trach aspirate to be sent today. CXR shows possible pneumonia. CT scan pre-op suggests possible aspiration, however the patient did not show signs of aspiration pre-op. Also, there is the possibility this was a mycotic aneurysm. The patient did not have any risk factors, but his CT scan could be consistent with this. His blood cultures have been negative so far. HE will be started on emperic antibiotics   Acute post-operative delerium: Possibly related to sepsis, anesthesia/narcotics, or ETHO withdrawal. CIWA protocol initiated   GI: OK to start enteral feeds   GU: Good UOP. Will give lasix dose as CXR looks like volume excess/CHF   Pulm:  CXR looks worse to me today, continue ABX and diuresis  Cardiac: Post of AFIB. Rate controlled on esmolol. Will continue. BNP elevated, will give BID lasix today   Prophylaxis: SQ heparin  I spoke with partner and family at length about the patients condition and answered all of their question.  F/E/N:  Hypokalemia secondary to lasix.  Treated with supplemental IV lasix   Wells Quin HoopBrabham   Malissa Slay W Keithen Capo 08/14/2013 9:38 AM --  Ceasar MonsFiled Vitals:   08/14/13 0857  BP: 112/76  Pulse: 83  Temp:   Resp: 20    Intake/Output Summary (Last 24 hours) at 08/14/13 0938 Last data filed at 08/14/13 0800  Gross per 24 hour  Intake 3676.23 ml  Output   4840 ml  Net -1163.77 ml     Laboratory CBC    Component Value Date/Time   WBC 9.6 08/14/2013 0245   HGB 9.7* 08/14/2013 0245   HCT 28.2* 08/14/2013 0245   PLT 250 08/14/2013 0245    BMET    Component Value Date/Time   NA 136* 08/14/2013 0245   K 3.1* 08/14/2013 0245   CL 101 08/14/2013 0245   CO2 22 08/14/2013 0245   GLUCOSE 98 08/14/2013 0245   BUN 16 08/14/2013 0245   CREATININE 0.84 08/14/2013 0245   CALCIUM 8.1* 08/14/2013 0245   GFRNONAA >90 08/14/2013 0245   GFRAA >90 08/14/2013 0245    COAG Lab Results  Component Value Date   INR 1.02 08/11/2013   INR 1.03 08/10/2013   No results found for this basename: PTT    Antibiotics Anti-infectives   Start     Dose/Rate Route Frequency Ordered Stop   08/13/13 1900  vancomycin (VANCOCIN) 1,250 mg in sodium chloride 0.9 % 250 mL IVPB     1,250 mg 166.7 mL/hr over 90 Minutes Intravenous Every 8 hours 08/13/13 0915     08/13/13 1030  vancomycin (VANCOCIN) 1,500 mg in sodium chloride 0.9 % 500 mL IVPB     1,500 mg 250 mL/hr over 120 Minutes Intravenous  Once 08/13/13 0915 08/13/13 1205   08/13/13 1030  piperacillin-tazobactam (ZOSYN) IVPB 3.375 g     3.375 g 12.5 mL/hr over 240 Minutes Intravenous Every 8 hours 08/13/13 0915     08/11/13 1730  vancomycin (VANCOCIN) IVPB 1000 mg/200 mL  premix     1,000 mg 200 mL/hr over 60 Minutes Intravenous Every 12 hours 08/11/13 1729 08/12/13 0600   08/11/13 1400  levofloxacin (LEVAQUIN) IVPB 500 mg     500 mg 100 mL/hr over 60 Minutes Intravenous To Surgery 08/11/13 1357 08/11/13 1400   08/11/13 0600  vancomycin (VANCOCIN) 1,500 mg in sodium chloride 0.9 % 500 mL IVPB     1,500 mg 250 mL/hr over 120 Minutes Intravenous On call 08/10/13 1648 08/11/13 0807       V. Charlena CrossWells Gee Habig IV, M.D. [facility] [place] Office: [phone] Pager:  [phone]

## 2013-08-14 NOTE — Progress Notes (Signed)
Fentanyl gtt bag changed. 30mL waste in sink. Franki CabotBrianna Holley RN witness waste. Koren BoundJennifer Agata Lucente

## 2013-08-14 NOTE — Progress Notes (Signed)
eLink Physician-Brief Progress Note Patient Name: Ardeth PerfectBart Stanard DOB: 1955-11-19 MRN: [id number]  Date of Service  08/14/2013   HPI/Events of Note     eICU Interventions  Replete K+   Intervention Category Intermediate Interventions: Electrolyte abnormality - evaluation and management  Merwyn KatosDavid B Naiyah Klostermann 08/14/2013, 7:08 PM

## 2013-08-15 ENCOUNTER — Inpatient Hospital Stay (HOSPITAL_COMMUNITY): Payer: BC Managed Care – PPO

## 2013-08-15 LAB — CBC WITH DIFFERENTIAL/PLATELET
BASOS PCT: 0 % (ref 0–1)
Basophils Absolute: 0 10*3/uL (ref 0.0–0.1)
EOS ABS: 0 10*3/uL (ref 0.0–0.7)
EOS PCT: 0 % (ref 0–5)
HCT: 31.1 % — ABNORMAL LOW (ref 39.0–52.0)
Hemoglobin: 10.6 g/dL — ABNORMAL LOW (ref 13.0–17.0)
Lymphocytes Relative: 13 % (ref 12–46)
Lymphs Abs: 1.3 10*3/uL (ref 0.7–4.0)
MCH: 31.4 pg (ref 26.0–34.0)
MCHC: 34.1 g/dL (ref 30.0–36.0)
MCV: 92 fL (ref 78.0–100.0)
Monocytes Absolute: 0.9 10*3/uL (ref 0.1–1.0)
Monocytes Relative: 9 % (ref 3–12)
Neutro Abs: 7.5 10*3/uL (ref 1.7–7.7)
Neutrophils Relative %: 78 % — ABNORMAL HIGH (ref 43–77)
PLATELETS: 280 10*3/uL (ref 150–400)
RBC: 3.38 MIL/uL — ABNORMAL LOW (ref 4.22–5.81)
RDW: 11.8 % (ref 11.5–15.5)
WBC: 9.7 10*3/uL (ref 4.0–10.5)

## 2013-08-15 LAB — BASIC METABOLIC PANEL
BUN: 15 mg/dL (ref 6–23)
CALCIUM: 8.5 mg/dL (ref 8.4–10.5)
CO2: 25 mEq/L (ref 19–32)
CREATININE: 0.86 mg/dL (ref 0.50–1.35)
Chloride: 104 mEq/L (ref 96–112)
GFR calc Af Amer: >90 mL/min (ref >90)
GLUCOSE: 120 mg/dL — AB (ref 70–99)
Potassium: 3.6 mEq/L — ABNORMAL LOW (ref 3.7–5.3)
SODIUM: 140 meq/L (ref 137–147)

## 2013-08-15 LAB — GLUCOSE, CAPILLARY
GLUCOSE-CAPILLARY: 115 mg/dL — AB (ref 70–99)
GLUCOSE-CAPILLARY: 128 mg/dL — AB (ref 70–99)
Glucose-Capillary: 122 mg/dL — ABNORMAL HIGH (ref 70–99)
Glucose-Capillary: 114 mg/dL — ABNORMAL HIGH (ref 70–99)
Glucose-Capillary: 101 mg/dL — ABNORMAL HIGH (ref 70–99)
Glucose-Capillary: 106 mg/dL — ABNORMAL HIGH (ref 70–99)

## 2013-08-15 LAB — CULTURE, RESPIRATORY

## 2013-08-15 LAB — PHOSPHORUS: Phosphorus: 3.1 mg/dL (ref 2.3–4.6)

## 2013-08-15 LAB — TRIGLYCERIDES: Triglycerides: 244 mg/dL — ABNORMAL HIGH (ref <150)

## 2013-08-15 LAB — CULTURE, RESPIRATORY W GRAM STAIN

## 2013-08-15 LAB — PRO B NATRIURETIC PEPTIDE: Pro B Natriuretic peptide (BNP): 401.5 pg/mL — ABNORMAL HIGH (ref 0–125)

## 2013-08-15 LAB — MAGNESIUM: Magnesium: 2.1 mg/dL (ref 1.5–2.5)

## 2013-08-15 MED ORDER — FUROSEMIDE 10 MG/ML IJ SOLN
40.0000 mg | Freq: Four times a day (QID) | INTRAMUSCULAR | Status: DC
  Administered 2013-08-15: 40 mg via INTRAVENOUS
  Filled 2013-08-15: qty 4

## 2013-08-15 MED ORDER — KETOROLAC TROMETHAMINE 30 MG/ML IJ SOLN
30.0000 mg | Freq: Once | INTRAMUSCULAR | Status: AC
  Administered 2013-08-15: 30 mg via INTRAVENOUS

## 2013-08-15 MED ORDER — FUROSEMIDE 10 MG/ML IJ SOLN: 20.0000 mg | Freq: Once | INTRAMUSCULAR | Status: DC

## 2013-08-15 MED ORDER — FUROSEMIDE 10 MG/ML IJ SOLN
20.0000 mg | Freq: Two times a day (BID) | INTRAMUSCULAR | Status: DC
  Administered 2013-08-15: 20 mg via INTRAVENOUS
  Filled 2013-08-15: qty 2

## 2013-08-15 MED ORDER — POTASSIUM CHLORIDE 20 MEQ/15ML (10%) PO LIQD
20.0000 meq | ORAL | Status: AC
  Administered 2013-08-15: 20 meq
  Filled 2013-08-15: qty 15

## 2013-08-15 MED ORDER — POTASSIUM CHLORIDE 10 MEQ/50ML IV SOLN
10.0000 meq | INTRAVENOUS | Status: DC
  Administered 2013-08-15: 10 meq via INTRAVENOUS
  Filled 2013-08-15: qty 50

## 2013-08-15 MED ORDER — KETOROLAC TROMETHAMINE 30 MG/ML IJ SOLN
INTRAMUSCULAR | Status: AC
  Filled 2013-08-15: qty 1

## 2013-08-15 MED ORDER — DEXTROSE 5 % IV SOLN
1.0000 g | INTRAVENOUS | Status: DC
  Administered 2013-08-15 – 2013-08-18 (×4): 1 g via INTRAVENOUS
  Filled 2013-08-15 (×5): qty 10

## 2013-08-15 MED ORDER — FENTANYL CITRATE 0.05 MG/ML IJ SOLN: 25.0000 ug | Freq: Once | INTRAMUSCULAR | Status: DC

## 2013-08-15 MED ORDER — POTASSIUM CHLORIDE 10 MEQ/100ML IV SOLN
10.0000 meq | INTRAVENOUS | Status: DC
  Filled 2013-08-15: qty 100

## 2013-08-15 MED ORDER — DILTIAZEM HCL 100 MG IV SOLR
5.0000 mg/h | INTRAVENOUS | Status: DC
  Administered 2013-08-15: 5 mg/h via INTRAVENOUS
  Filled 2013-08-15: qty 100

## 2013-08-15 MED ORDER — DEXMEDETOMIDINE HCL IN NACL 200 MCG/50ML IV SOLN
0.4000 ug/kg/h | INTRAVENOUS | Status: DC
  Administered 2013-08-15: 0.4 ug/kg/h via INTRAVENOUS
  Administered 2013-08-15: 0.5 ug/kg/h via INTRAVENOUS
  Administered 2013-08-16: 1.2 ug/kg/h via INTRAVENOUS
  Filled 2013-08-15 (×4): qty 50

## 2013-08-15 NOTE — Progress Notes (Addendum)
Wasted 200 ml fentanyl from IV infusion bag in the sink. Tommi EmeryShanna M Hintz Verified by Serena Colonelyan P Ophia Shamoon

## 2013-08-15 NOTE — Progress Notes (Signed)
[place] ICU Electrolyte Replacement Protocol  Patient Name: Custer Pimenta DOB: 05/27/1955 MRN: [id number]  Date of Service  08/15/2013   HPI/Events of Note    Recent Labs Lab 08/11/13 0258 08/11/13 1640 08/12/13 0359 08/13/13 0425 08/14/13 0245 08/14/13 1600 08/15/13 0350  NA 138 135* 138 140 136*  --  140  K 4.5 4.4 4.4 4.0 3.1* 3.2* 3.6*  CL 103 101 104 107 101  --  104  CO2 24 20 22 19 22   --  25  GLUCOSE 114* 150* 100* 77 98  --  120*  BUN 22 18 17 17 16   --  15  CREATININE 1.00 0.92 1.04 0.89 0.84  --  0.86  CALCIUM 9.1 8.6 8.1* 8.0* 8.1*  --  8.5  MG  --  1.9  --  2.1 1.9  --  2.1  PHOS  --   --   --  2.5 3.4  --  3.1    Estimated Creatinine Clearance: 119 ml/min (by C-G formula based on Cr of 0.86).  Intake/Output     04/24 0701 - 04/25 0700   I.V. (mL/kg) 1498.3 (15.4)   NG/GT 1450   IV Piggyback 700   Total Intake(mL/kg) 3648.3 (37.6)   Urine (mL/kg/hr) 5175 (2.2)   Total Output 5175   Net -1526.7       Stool Occurrence 3 x    - I/O DETAILED x24h    Total I/O In: 1512.9 [I.V.:752.9; NG/GT:760] Out: 1150 [Urine:1150] - I/O THIS SHIFT    ASSESSMENT   eICURN Interventions  ICU Electrolyte Replacement Protocol criteria met.Labs Replaced per protocol.MD notified    ASSESSMENT: MAJOR ELECTROLYTE    Lorene Dy 08/15/2013, 6:43 AM

## 2013-08-15 NOTE — Progress Notes (Addendum)
[facility] [place]  Subjective  - BM yesterday.   Intubated and sedated.   Objective 118/65 78 98.8 F (37.1 C) (Oral) 20 100%  Intake/Output Summary (Last 24 hours) at 08/15/13 0752 Last data filed at 08/15/13 0700  Gross per 24 hour  Intake 3702.35 ml  Output   5175 ml  Net -1472.65 ml   His abdomen is soft as are his access sites in each groin. Both feet are warm with doppler signals    Assessment/Planning: POD #5 s/p EVAR for PAU / inflamatory aneurysm.   BNP elevated no new labs today yet. Given lasix BID and reordered for today.  Lovenox 40 sq QD DVT prophylaxis  Cardiac: Post of AFIB. Rate controlled on esmolol. NSR since yesterday  Plan  Dc vanc  Dc zosyn  Start levaquin (growing GNR in sputum)  Narrow as cultures come  Plan Extubation today   Walter Morgan 08/15/2013 7:52 AM --  Laboratory Lab Results:  Recent Labs  08/14/13 0245 08/15/13 0350  WBC 9.6 9.7  HGB 9.7* 10.6*  HCT 28.2* 31.1*  PLT 250 280   BMET  Recent Labs  08/14/13 0245 08/14/13 1600 08/15/13 0350  NA 136*  --  140  K 3.1* 3.2* 3.6*  CL 101  --  104  CO2 22  --  25  GLUCOSE 98  --  120*  BUN 16  --  15  CREATININE 0.84  --  0.86  CALCIUM 8.1*  --  8.5    COAG Lab Results  Component Value Date   INR 1.02 08/11/2013   INR 1.03 08/10/2013   No results found for this basename: PTT  Addendum  I have independently interviewed and examined the patient, and I agree with the physician assistant's findings.  Just extubated.  Still combative but can be calmed down by family.  On Esmolol for pressure control.  Suspect will be able to titrate down as AMS resolves.  Likely multifactorial AMS including EtOH withdrawal.  >3 L positive.  Lasix 20 mg IV q12 x 2 today for target -1L.  Replete KCl already ordered.  Still low grade temp.  GNR on sputum cx.  On Levaquin while C&S pending.  Keep in ICU for now given airway risks.  Leonides SakeBrian Chen,  MD [facility] [place] Office: [phone] Pager: [phone]  08/15/2013, 9:16 AM

## 2013-08-15 NOTE — Procedures (Signed)
Extubation Procedure Note  Patient Details:   Name: Walter Morgan DOB: April 01, 1956 MRN: [id number]   Airway Documentation:     Evaluation  O2 sats: stable throughout and currently acceptable Complications: No apparent complications Patient did tolerate procedure well. Bilateral Breath Sounds: Diminished;Rhonchi Suctioning: Airway   Walter Morgan 08/15/2013, 9:15 AM

## 2013-08-15 NOTE — Progress Notes (Signed)
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 His partner reports that he has been a daily drinker for some time, and has had experiences like this before. Also states, however that he may not have had a drink for 2 weeks. He presented to the ER 4/19 with approximately 1 week of abdominal and back pain. He had been seen bby his PCP and treated with muscle relaxers and narcotics without relief. He reported no aggravating or relieving factors. 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 He had surgical repair of the aneurysm 4/21. Postoperatively he was combative with staff and disoriented. He was started on precedex infusion with some improvement. 4/21 PM he remains agitated and screaming at staff, trying to climb out of bed. PCCM asked to see for help managing delirium.    LINES / TUBES: Left Radial Art Line 4/21 (accuracy uncertain)>>>? date RIJ CVL 4/21 >>> Foley 4/21 >>> ETT 4/22 >> OG tube 4/22 >>  CULTURES: Blood 4/19 >>>NTD Trach aspirate 4/23>>>GNR Urine 4/23 >>NTD  ANTIBIOTICS: Vanc 4/19 >>>4/24 Zosyn start date unknown>>>4/24 Levaquin 4/24>>>  SIGNIFICANT EVENTS / STUDIES:  4/21 CT abodmoen >>> Abnormal retroperitoneal stranding surrounding a focal dissection and possible intramural hematoma along a minimally aneurysmal segment of the infrarenal abdominal aorta. 3.3 x 2.8cm. 4/21 AAA repair  08/12/13: On precedex 1.2, esmolol and just got ativan 2mg : RASS +3 with all this  08/13/13: On 30mcg dripivan and 150mcg fentanyl. Got very agitated on WUA. Occ will follow commands. Febrile again. Not on pressors but on esmolol.    SUBJECTIVE/OVERNIGHT/INTERVAL HX 08/14/13: Overall strategy of weaning fentanyl but continuing diprivan with ativan prn seems working well but stimulation x 2 has resulted in significant agitation. On Tube feeds now. Stil constipated; enema pending. Still on esmolol for sBP  < 130/MAP < 70. Still trying to go in and out of A Fib. CXR  Bilateral airspace disease ? CHF NOS v ALI; on 40% fio2  VITAL SIGNS: Temp:  [98 F (36.7 C)-100.4 F (38 C)] 98 F (36.7 C) (04/25 0752) Pulse Rate:  [62-101] 78 (04/25 0715) Resp:  [19-29] 20 (04/25 0715) BP: (101-157)/(53-90) 118/65 mmHg (04/25 0700) SpO2:  [91 %-100 %] 100 % (04/25 0715) FiO2 (%):  [40 %] 40 % (04/25 0700) Weight:  [213 lb 13.5 oz (97 kg)] 213 lb 13.5 oz (97 kg) (04/25 0445) HEMODYNAMICS:   VENTILATOR SETTINGS: Vent Mode:  [-] PRVC FiO2 (%):  [40 %] 40 % Set Rate:  [20 bmp] 20 bmp Vt Set:  [630 mL] 630 mL PEEP:  [5 cmH20] 5 cmH20 Plateau Pressure:  [17 cmH20-20 cmH20] 20 cmH20 INTAKE / OUTPUT: Intake/Output     04/24 0701 - 04/25 0700 04/25 0701 - 04/26 0700   I.V. (mL/kg) 1552.4 (16)    NG/GT 1500    IV Piggyback 700    Total Intake(mL/kg) 3752.4 (38.7)    Urine (mL/kg/hr) 5175 (2.2)    Emesis/NG output     Total Output 5175     Net -1422.7          Stool Occurrence 3 x     PHYSICAL EXAMINATION: General:  Overweight male (245 lbs) anxious  and combative in bed.  Neuro:  Alert, disoriented. Does not follow commands. Screaming phrases that do not make sense HEENT:  [place]/AT, PERRL Cardiovascular:  Tachy Lungs:  Clear anteriorly Abdomen:  Soft, non-distended Musculoskeletal:  No acute deformity/ROM limitations Skin:  Intact  LABS:  PULMONARY  Recent Labs Lab 08/11/13 1806 08/11/13 2334 08/12/13 1241  PHART 7.434  --  7.349*  PCO2ART 29.0*  --  38.7  PO2ART 54.0*  --  188.0*  HCO3 19.4*  --  21.2  TCO2 20  --  22  O2SAT 89.0 31.5 100.0    CBC  Recent Labs Lab 08/13/13 0922 08/14/13 0245 08/15/13 0350   HGB 9.6* 9.7* 10.6*  HCT 28.9* 28.2* 31.1*  WBC 10.0 9.6 9.7  PLT 222 250 280    COAGULATION  Recent Labs Lab 08/10/13 0032 08/11/13 1640  INR 1.03 1.02    CARDIAC    Recent Labs Lab 08/13/13 0922 08/13/13 1654 08/14/13 0245  TROPONINI <0.30 <0.30 <0.30    Recent Labs Lab 08/14/13 0245 08/15/13 0350  PROBNP 1092.0* 401.5*     CHEMISTRY  Recent Labs Lab 08/11/13 0258 08/11/13 1640 08/12/13 0359 08/13/13 0425 08/14/13 0245 08/14/13 1600 08/15/13 0350  NA 138 135* 138 140 136*  --  140  K 4.5 4.4 4.4 4.0 3.1* 3.2* 3.6*  CL 103 101 104 107 101  --  104  CO2 24 20 22 19 22   --  25  GLUCOSE 114* 150* 100* 77 98  --  120*  BUN 22 18 17 17 16   --  15  CREATININE 1.00 0.92 1.04 0.89 0.84  --  0.86  CALCIUM 9.1 8.6 8.1* 8.0* 8.1*  --  8.5  MG  --  1.9  --  2.1 1.9  --  2.1  PHOS  --   --   --  2.5 3.4  --  3.1   Estimated Creatinine Clearance: 119 ml/min (by C-G formula based on Cr of 0.86).   LIVER  Recent Labs Lab 08/09/13 1416 08/10/13 0032 08/11/13 1640  AST 44* 39*  --   ALT 84* 80*  --   ALKPHOS 103 129*  --   BILITOT 0.5 0.5  --   PROT 8.0 7.4  --   ALBUMIN 3.6 3.3*  --   INR  --  1.03 1.02     INFECTIOUS  Recent Labs Lab 08/12/13 0030 08/12/13 1130 08/13/13 0425 08/14/13 0245  LATICACIDVEN 1.5  --   --  0.9  PROCALCITON  --  0.37 0.36 0.36     ENDOCRINE CBG (last 3)   Recent Labs  08/14/13 2009 08/15/13 0031 08/15/13 0354  GLUCAP 101* 128* 115*   IMAGING x48h  Dg Chest Port 1 View  08/14/2013   CLINICAL DATA:  Abdominal aortic aneurysm stent graft  EXAM: PORTABLE CHEST - 1 VIEW  COMPARISON:  08/13/2013  FINDINGS: Endotracheal tube in good position. Right jugular catheter tip in the SVC. NG tube in place and unchanged.  Progression of bilateral pulmonary edema. Progression of bibasilar atelectasis and effusion.  IMPRESSION: Progression of congestive heart failure with edema and bilateral effusions.   Electronically  Signed   By: Marlan Palau M.D.   On: 08/14/2013 07:50   ASSESSMENT / PLAN:  NEUROLOGIC A:   Delirium - could be related to ETOH withdrawal as he has been reported a daily drinker by his partner. He has been calling out from the room "take me to the bar, I'll sober up  there", so withdrawal remains a likley etiology.  - severe delirium persists.  Etoh abuse. P:   - Diprivan. - Fentanyl gtt. - Ativan prn. - Precedex.  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  HTN  - s/p nipride for 12h  - now on esmolol and weaning off. In NSR but when agitated apparently in A Fib. CXR looks wet P:  - ECHO - EF 60-65%. - Currently on esmolol drip, prn labetalol, prn hydralazine for BP control. - May need increase in BP control now that sedation is off and will be extubated today.  PULMONARY A: VDRF since 08/13/13 due to acute encephalopath Very agitated and will not be able to wean calmly. P:   - Extubate today - If fails will reintubate. - CXR and ABG in AM.  GI A: Intubated P - If having to reintubate will need to start TF.  RENAL A: Normal. Mag and K less P - Lasix 40 mg IV q6 x3 doses. - Replace electrolytes as indicated. - BMET in AM.  HEME A: Anemia of critical illness P PRBC for hgb < 7gm%  ID A: spiking fevers, Tracheal aspirate and cxr suggests likely pneumonia P - GNR in sputum, speciation pending. - Changed from vanc/zosyn to levaquin due to rash.  The patient is critically ill with multiple organ systems failure and requires high complexity decision making for assessment and support, frequent evaluation and titration of therapies, application of advanced monitoring technologies and extensive interpretation of multiple databases.   Critical Care Time devoted to patient care services described in this note is  35  Minutes.  Alyson ReedyWesam G. Yacoub, M.D. [facility]. Pager: [phone]. After hours pager:  [phone].

## 2013-08-15 NOTE — Evaluation (Signed)
Clinical/Bedside Swallow Evaluation Patient Details  Name: Walter Morgan MRN: [id number] Date of Birth: 1955/05/13  Today's Date: 08/15/2013 Time: [id number] SLP Time Calculation (min): 32 min  Past Medical History:  Past Medical History  Diagnosis Date  . GERD (gastroesophageal reflux disease)   . Diabetes     possibly early stage, not formally diagnosed- MD wanted to start on metformin- is not taking per patient   . H/O hiatal hernia     repaired   Past Surgical History:  Past Surgical History  Procedure Laterality Date  . Appendectomy    . Cholecystectomy    . Hernia repair    . 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 His partner reports that he has been a daily drinker for some time, and has had experiences like this before. Also states, however that he may not have had a drink for 2 weeks. He presented to the ER 4/19 with approximately 1 week of abdominal and back pain. He had been seen bby his PCP and treated with muscle relaxers and narcotics without relief. He reported no aggravating or relieving factors. 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 He had surgical repair of the aneurysm 4/21. Postoperatively he was combative with staff and disoriented. He was intubated from 4/22 to 4/25 in am.    Assessment / Plan / Recommendation Clinical Impression  Pt demonstrated severely impaired cognition and awareness, required max cues for pt to recognize POs. Pt is most responsive to his partner at bedside and would accept limited PO from SLP. He was observed to tolerate thin liquids and solids without evidence of aspiration or significant struggle. Recommend pt initate a dys 3/thin liquid diet with full supervision. SLP will  f/u in 2 days for tolerance. Discussed aspiration precautions with caregiver.     Aspiration Risk  Mild    Diet Recommendation Dysphagia 3 (Mechanical Soft);Thin liquid   Liquid Administration via: Cup;Straw Medication Administration: Crushed with puree (ok to try otehr methods, pt likely to spit out pill) Supervision: Staff to assist with self feeding;Full supervision/cueing for compensatory strategies;Trained caregiver to feed patient Compensations: Slow rate;Small sips/bites Postural Changes and/or Swallow Maneuvers: Seated upright 90 degrees    Other  Recommendations     Follow Up Recommendations  Inpatient Rehab    Frequency and Duration min 2x/week  1 week   Pertinent Vitals/Pain NA    SLP Swallow Goals     Swallow Study Prior Functional Status       General HPI: 58 year old current smoker with 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 His partner reports that he has been a daily drinker for some time, and has had experiences like this before. Also states, however that he may not have had a drink for 2 weeks. He presented to the ER 4/19 with approximately 1 week of abdominal and back pain. He had been seen bby his PCP and treated with muscle relaxers and narcotics without relief. He reported no aggravating or relieving factors. 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 He had surgical repair of the aneurysm 4/21. Postoperatively he was combative with staff and disoriented. He was intubated from 4/22 to 4/25 in am.  Type of Study:  Bedside swallow evaluation Previous Swallow Assessment: none Diet Prior to this Study: NPO Temperature Spikes Noted: No Respiratory Status: Nasal cannula History of Recent Intubation: Yes Length of Intubations (days): 3 days Date extubated: 08/15/13 Behavior/Cognition: Confused;Agitated;Impulsive;Alert Oral Cavity - Dentition: Missing dentition (top dentures at bedside) Self-Feeding Abilities: Able to feed self;Needs  assist Patient Positioning: Upright in bed Baseline Vocal Quality: Low vocal intensity Volitional Cough: Strong Volitional Swallow: Able to elicit    Oral/Motor/Sensory Function Overall Oral Motor/Sensory Function: Appears within functional limits for tasks assessed   Ice Chips     Thin Liquid Thin Liquid: Within functional limits Presentation: Cup;Straw (required max cues for awareness of bolus)    Nectar Thick Nectar Thick Liquid: Not tested   Honey Thick Honey Thick Liquid: Not tested   Puree Puree: Within functional limits   Solid   GO    Solid: Within functional limits      [facility]ainesvilleBonnie Neil Brickell, MA CCC-SLP [phone]  Walter NearingBonnie Caroline Amiere Cawley 08/15/2013,3:03 PM

## 2013-08-15 NOTE — Progress Notes (Signed)
Between  1020 AM and 10 150 AM patient went into an afib rhythm with rates reaching up in to the 120s-140s intermittently.  CCM was notified of rhythm issues and, per MD, patient started on IV cardizem and IV esmolol stopped.  Will continue to monitor rhythm.  Patient reverted from afib rhythm, but is still showing tachycardic rates (110s) with PACs.  Walter DoughtyShanna Nida BoatmanM Yazmen Morgan

## 2013-08-16 ENCOUNTER — Inpatient Hospital Stay (HOSPITAL_COMMUNITY): Payer: BC Managed Care – PPO

## 2013-08-16 LAB — BASIC METABOLIC PANEL
BUN: 17 mg/dL (ref 6–23)
CO2: 26 mEq/L (ref 19–32)
Calcium: 8.6 mg/dL (ref 8.4–10.5)
Chloride: 100 mEq/L (ref 96–112)
Creatinine, Ser: 0.81 mg/dL (ref 0.50–1.35)
GLUCOSE: 101 mg/dL — AB (ref 70–99)
POTASSIUM: 3.3 meq/L — AB (ref 3.7–5.3)
Sodium: 142 mEq/L (ref 137–147)

## 2013-08-16 LAB — GLUCOSE, CAPILLARY
GLUCOSE-CAPILLARY: 96 mg/dL (ref 70–99)
GLUCOSE-CAPILLARY: 92 mg/dL (ref 70–99)
Glucose-Capillary: 119 mg/dL — ABNORMAL HIGH (ref 70–99)
Glucose-Capillary: 100 mg/dL — ABNORMAL HIGH (ref 70–99)
Glucose-Capillary: 100 mg/dL — ABNORMAL HIGH (ref 70–99)
Glucose-Capillary: 93 mg/dL (ref 70–99)

## 2013-08-16 LAB — CBC WITH DIFFERENTIAL/PLATELET
Basophils Absolute: 0 10*3/uL (ref 0.0–0.1)
Basophils Relative: 0 % (ref 0–1)
Eosinophils Absolute: 0 10*3/uL (ref 0.0–0.7)
Eosinophils Relative: 0 % (ref 0–5)
HCT: 32.8 % — ABNORMAL LOW (ref 39.0–52.0)
HEMOGLOBIN: 11.6 g/dL — AB (ref 13.0–17.0)
LYMPHS ABS: 1.5 10*3/uL (ref 0.7–4.0)
Lymphocytes Relative: 13 % (ref 12–46)
MCH: 32 pg (ref 26.0–34.0)
MCHC: 35.4 g/dL (ref 30.0–36.0)
MCV: 90.6 fL (ref 78.0–100.0)
MONOS PCT: 9 % (ref 3–12)
Monocytes Absolute: 1.1 10*3/uL — ABNORMAL HIGH (ref 0.1–1.0)
NEUTROS ABS: 9 10*3/uL — AB (ref 1.7–7.7)
NEUTROS PCT: 78 % — AB (ref 43–77)
Platelets: 340 10*3/uL (ref 150–400)
RBC: 3.62 MIL/uL — AB (ref 4.22–5.81)
RDW: 11.5 % (ref 11.5–15.5)
WBC: 11.6 10*3/uL — ABNORMAL HIGH (ref 4.0–10.5)

## 2013-08-16 LAB — CULTURE, BLOOD (ROUTINE X 2)
Culture: NO GROWTH
Culture: NO GROWTH

## 2013-08-16 LAB — PHOSPHORUS: Phosphorus: 4.4 mg/dL (ref 2.3–4.6)

## 2013-08-16 LAB — MAGNESIUM: MAGNESIUM: 2 mg/dL (ref 1.5–2.5)

## 2013-08-16 MED ORDER — PANTOPRAZOLE SODIUM 40 MG PO TBEC
40.0000 mg | DELAYED_RELEASE_TABLET | Freq: Every day | ORAL | Status: DC
  Administered 2013-08-16 – 2013-08-19 (×4): 40 mg via ORAL
  Filled 2013-08-16 (×4): qty 1

## 2013-08-16 MED ORDER — DILTIAZEM HCL 30 MG PO TABS
30.0000 mg | ORAL_TABLET | Freq: Four times a day (QID) | ORAL | Status: DC
  Administered 2013-08-16 – 2013-08-19 (×13): 30 mg via ORAL
  Filled 2013-08-16 (×18): qty 1

## 2013-08-16 MED ORDER — POTASSIUM CHLORIDE CRYS ER 20 MEQ PO TBCR
40.0000 meq | EXTENDED_RELEASE_TABLET | Freq: Three times a day (TID) | ORAL | Status: AC
  Administered 2013-08-16 (×2): 40 meq via ORAL
  Filled 2013-08-16 (×2): qty 2

## 2013-08-16 MED ORDER — HYDROCODONE-ACETAMINOPHEN 7.5-325 MG PO TABS
1.0000 | ORAL_TABLET | Freq: Four times a day (QID) | ORAL | Status: DC | PRN
  Administered 2013-08-16 – 2013-08-19 (×9): 1 via ORAL
  Filled 2013-08-16 (×10): qty 1

## 2013-08-16 NOTE — Progress Notes (Signed)
In to see patient at this time, RIJ dressing drooping down at this time. RIJ central line exposed 3 cm upon further assessment under dressing. Medication currently infusing without complications, negative for blood return. MD Deterding called at this time and notified of findings. Chest Xray placed to confirm placement of central line. Will continue to monitor the patient closely.

## 2013-08-16 NOTE — Progress Notes (Addendum)
   Daily Progress Note  Assessment/Planning: POD #5 s/p EVAR for PAU/inflammatory AAA   Improved mentation on precedex  On Diltazem drip for afib  Keep in SICU while weaning down precedex  Net fluid negative now: hold on further diuresis, likely resolving 3rd spacing  Subjective  - 5 Days Post-Op  No complaints  Objective Filed Vitals:   08/16/13 0100 08/16/13 0200 08/16/13 0300 08/16/13 0400  BP: 126/66 131/69 127/68 134/73  Pulse: 80 72 79 78  Temp:    98.8 F (37.1 C)  TempSrc:    Oral  Resp: 23 18 22 16   Height:      Weight:      SpO2: 96% 96% 94% 97%    Intake/Output Summary (Last 24 hours) at 08/16/13 0447 Last data filed at 08/16/13 0200  Gross per 24 hour  Intake 1261.9 ml  Output   5382 ml  Net -4120.1 ml   GEN Greatly improved mental status PULM  CTAB CV  RRR GI  soft, NTND, both groin incision bandaged without active bleeding or hematoma VASC  Viable feet bilaterally  Laboratory CBC    Component Value Date/Time   WBC 9.7 08/15/2013 0350   HGB 10.6* 08/15/2013 0350   HCT 31.1* 08/15/2013 0350   PLT 280 08/15/2013 0350    BMET    Component Value Date/Time   NA 140 08/15/2013 0350   K 3.6* 08/15/2013 0350   CL 104 08/15/2013 0350   CO2 25 08/15/2013 0350   GLUCOSE 120* 08/15/2013 0350   BUN 15 08/15/2013 0350   CREATININE 0.86 08/15/2013 0350   CALCIUM 8.5 08/15/2013 0350   GFRNONAA >90 08/15/2013 0350   GFRAA >90 08/15/2013 0350    Leonides SakeBrian Aditya Nastasi, MD [facility] Office: [phone] Pager: [phone]  08/16/2013, 4:47 AM

## 2013-08-16 NOTE — Progress Notes (Signed)
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 His partner reports that he has been a daily drinker for some time, and has had experiences like this before. Also states, however that he may not have had a drink for 2 weeks. He presented to the ER 4/19 with approximately 1 week of abdominal and back pain. He had been seen bby his PCP and treated with muscle relaxers and narcotics without relief. He reported no aggravating or relieving factors. 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 He had surgical repair of the aneurysm 4/21. Postoperatively he was combative with staff and disoriented. He was started on precedex infusion with some improvement. 4/21 PM he remains agitated and screaming at staff, trying to climb out of bed. PCCM asked to see for help managing delirium.    LINES / TUBES: Left Radial Art Line 4/21 (accuracy uncertain)>>>out RIJ CVL 4/21 >>>4/26 Foley 4/21 >>>4/25 ETT 4/22 >>4/25 OG tube 4/22 >>4/25  CULTURES: Blood 4/19 >>>NTD Trach aspirate 4/23>>>GNR Urine 4/23 >>NTD  ANTIBIOTICS: Vanc 4/19 >>>4/24 Zosyn start date unknown>>>4/24 Levaquin 4/24>>>  SIGNIFICANT EVENTS / STUDIES:  4/21 CT abodmoen >>> Abnormal retroperitoneal stranding surrounding a focal dissection and possible intramural hematoma along a minimally aneurysmal segment of the infrarenal abdominal aorta. 3.3 x 2.8cm. 4/21 AAA repair  08/12/13: On precedex 1.2, esmolol and just got ativan 2mg : RASS +3 with all this  08/13/13: On dripivan and fentanyl. Got very agitated on WUA. Occ will follow commands. Febrile again. Not on pressors but on esmolol.    SUBJECTIVE/OVERNIGHT/INTERVAL HX Extubated and feels much better, no complaints.  VITAL SIGNS: Temp:  [98.5 F (36.9 C)-100.5 F (38.1 C)] 98.5 F (36.9 C) (04/26 0727) Pulse Rate:  [72-114] 92 (04/26 0700) Resp:  [16-41] 19 (04/26 0700) BP: (126-157)/(66-93) 139/77 mmHg (04/26 0700) SpO2:  [92 %-98 %] 98 % (04/26 0700) Weight:  [205 lb 14.6 oz (93.4 kg)] 205 lb 14.6 oz (93.4 kg) (04/26 0600) HEMODYNAMICS:   VENTILATOR SETTINGS:   INTAKE / OUTPUT: Intake/Output     04/25 0701 - 04/26 0700 04/26 0701 - 04/27 0700   I.V. (mL/kg) 872.1 (9.3) 8.5 (0.1)   NG/GT 100    IV Piggyback 100    Total Intake(mL/kg) 1072.1 (11.5) 8.5 (0.1)   Urine (mL/kg/hr) 6220 (2.8)    Stool 7 (0)    Total Output 6227     Net -5155 +8.5         PHYSICAL EXAMINATION: General:  Alert, oriented and interactive.  Neuro:  Alert, oriented. Follow commands. HEENT:  [place]/AT, PERRL Cardiovascular:  RRR, Nl S1/S2, -M/R/G. Lungs: Clear anteriorly Abdomen:  Soft, non-distended Musculoskeletal:  No acute deformity/ROM limitations Skin:  Intact  LABS:  PULMONARY  Recent Labs Lab 08/11/13 1806 08/11/13 2334 08/12/13 1241  PHART 7.434  --  7.349*  PCO2ART 29.0*  --  38.7  PO2ART 54.0*  --  188.0*  HCO3 19.4*  --  21.2  TCO2 20  --  22  O2SAT 89.0 31.5 100.0   CBC  Recent Labs Lab 08/14/13 0245 08/15/13 0350 08/16/13 0520  HGB 9.7* 10.6* 11.6*  HCT 28.2* 31.1* 32.8*  WBC 9.6  9.7 11.6*  PLT 250 280 340   COAGULATION  Recent Labs Lab 08/10/13 0032 08/11/13 1640  INR 1.03 1.02   CARDIAC   Recent Labs Lab 08/13/13 0922 08/13/13 1654 08/14/13 0245  TROPONINI <0.30 <0.30 <0.30   Recent Labs Lab 08/14/13 0245 08/15/13 0350  PROBNP 1092.0* 401.5*   CHEMISTRY  Recent Labs Lab 08/11/13 1640 08/12/13 0359 08/13/13 0425 08/14/13 0245  08/15/13 0350 08/16/13 0520  NA 135* 138 140 136*  --  140 142  K 4.4 4.4 4.0 3.1*  < > 3.6* 3.3*  CL 101 104 107 101  --  104 100  CO2  20 22 19 22   --  25 26  GLUCOSE 150* 100* 77 98  --  120* 101*  BUN 18 17 17 16   --  15 17  CREATININE 0.92 1.04 0.89 0.84  --  0.86 0.81  CALCIUM 8.6 8.1* 8.0* 8.1*  --  8.5 8.6  MG 1.9  --  2.1 1.9  --  2.1 2.0  PHOS  --   --  2.5 3.4  --  3.1 4.4  < > = values in this interval not displayed. Estimated Creatinine Clearance: 116.5 ml/min (by C-G formula based on Cr of 0.81).  LIVER  Recent Labs Lab 08/09/13 1416 08/10/13 0032 08/11/13 1640  AST 44* 39*  --   ALT 84* 80*  --   ALKPHOS 103 129*  --   BILITOT 0.5 0.5  --   PROT 8.0 7.4  --   ALBUMIN 3.6 3.3*  --   INR  --  1.03 1.02   INFECTIOUS  Recent Labs Lab 08/12/13 0030 08/12/13 1130 08/13/13 0425 08/14/13 0245  LATICACIDVEN 1.5  --   --  0.9  PROCALCITON  --  0.37 0.36 0.36   ENDOCRINE CBG (last 3)   Recent Labs  08/16/13 0011 08/16/13 0402 08/16/13 0724  GLUCAP 119* 96 100*   IMAGING x48h  Dg Chest Port 1 View  08/16/2013   CLINICAL DATA:  Evaluate central line position  EXAM: PORTABLE CHEST - 1 VIEW  COMPARISON:  Prior chest x-ray 08/15/2013  FINDINGS: Interval extubation and removal of nasogastric tube. The right IJ approach central venous catheter as significantly pulled back and is nearly displaced. The tip is likely just within the internal jugular vein. Improved pulmonary aeration. Inspiratory volumes are greater and there is decreased bibasilar atelectasis and interstitial edema. Cardiac and mediastinal contours are within normal limits. No pneumothorax or effusion.  IMPRESSION: 1. The right IJ approach central venous catheter has pulled back and is nearly extravascular. The tip is likely just within the internal jugular vein. 2. Interval extubation and removal of nasogastric tube. 3. Significant interval improvement in pulmonary aeration with greater inspiratory volumes, resolved interstitial edema and resolved bibasilar atelectasis. These results were called by telephone at the time of interpretation on  08/16/2013 at 7:40 AM to the patient's nurse, Devonne DoughtyShanna, who verbally acknowledged these results.   Electronically Signed   By: Malachy MoanHeath  McCullough M.D.   On: 08/16/2013 07:40   Dg Chest Port 1 View  08/15/2013   CLINICAL DATA:  Endotracheal tube placement  EXAM: PORTABLE CHEST - 1 VIEW  COMPARISON:  DG CHEST 1V PORT dated 08/14/2013; DG CHEST 1V PORT dated 08/13/2013; DG CHEST 1V PORT dated 08/12/2013  FINDINGS: Endotracheal tube remains above 4.5 cm above the carina. No change in the position of NG tube or right central line.  Hazy bilateral perihilar opacity again identified with mild  interstitial prominence. The severity of both of these process ease has decreased when compared to the prior study. There is mild blunting of both costophrenic angle suggesting mild small pleural effusion.  IMPRESSION: Congestive heart failure with bilateral pulmonary edema, moderately improved when compared to the prior examination.   Electronically Signed   By: Esperanza Heiraymond  Rubner M.D.   On: 08/15/2013 08:08   ASSESSMENT / PLAN:  NEUROLOGIC A:   Delirium - could be related to ETOH withdrawal as he has been reported a daily drinker by his partner. He has been calling out from the room "take me to the bar, I'll sober up there", so withdrawal remains a likley etiology.  - severe delirium persists.  Etoh abuse. P:   - Ativan prn. - D/C Precedex.  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  HTN  - s/p nipride for 12h  - now on esmolol and weaning off. In NSR but when agitated apparently in A Fib. CXR looks wet P:  - ECHO - EF 60-65%. - D/C esmolol. - D/C IV cardizem and change to PO.  PULMONARY A: VDRF since 08/13/13 due to acute encephalopath Very agitated and will not be able to wean calmly. P:   - Extubated and doing well. - CXR and ABG in AM.  GI A: No active issues. P - Heart healthy diet.  RENAL A: Normal. Mag and K less P - D/C lasix. - Replace electrolytes as  indicated. - BMET in AM.  HEME A: Anemia of critical illness P PRBC for hgb < 7gm%  ID A: spiking fevers, Tracheal aspirate and cxr suggests likely pneumonia P - GNR in sputum, speciation pending. - Changed from vanc/zosyn to levaquin due to rash.  Alyson ReedyWesam G. Yacoub, M.D. [facility]. Pager: [phone]. After hours pager: [phone].

## 2013-08-17 DIAGNOSIS — I714 Abdominal aortic aneurysm, without rupture, unspecified: Principal | ICD-10-CM

## 2013-08-17 LAB — GLUCOSE, CAPILLARY
GLUCOSE-CAPILLARY: 105 mg/dL — AB (ref 70–99)
Glucose-Capillary: 100 mg/dL — ABNORMAL HIGH (ref 70–99)
Glucose-Capillary: 133 mg/dL — ABNORMAL HIGH (ref 70–99)
Glucose-Capillary: 139 mg/dL — ABNORMAL HIGH (ref 70–99)
Glucose-Capillary: 80 mg/dL (ref 70–99)
Glucose-Capillary: 91 mg/dL (ref 70–99)
Glucose-Capillary: 94 mg/dL (ref 70–99)

## 2013-08-17 LAB — CBC WITH DIFFERENTIAL/PLATELET
BASOS ABS: 0 10*3/uL (ref 0.0–0.1)
BASOS PCT: 0 % (ref 0–1)
EOS ABS: 0 10*3/uL (ref 0.0–0.7)
Eosinophils Relative: 0 % (ref 0–5)
HCT: 33.9 % — ABNORMAL LOW (ref 39.0–52.0)
HEMOGLOBIN: 11.7 g/dL — AB (ref 13.0–17.0)
Lymphocytes Relative: 17 % (ref 12–46)
Lymphs Abs: 1.8 10*3/uL (ref 0.7–4.0)
MCH: 31.7 pg (ref 26.0–34.0)
MCHC: 34.5 g/dL (ref 30.0–36.0)
MCV: 91.9 fL (ref 78.0–100.0)
Monocytes Absolute: 1.1 10*3/uL — ABNORMAL HIGH (ref 0.1–1.0)
Monocytes Relative: 11 % (ref 3–12)
NEUTROS ABS: 7.8 10*3/uL — AB (ref 1.7–7.7)
NEUTROS PCT: 72 % (ref 43–77)
PLATELETS: 356 10*3/uL (ref 150–400)
RBC: 3.69 MIL/uL — ABNORMAL LOW (ref 4.22–5.81)
RDW: 11.7 % (ref 11.5–15.5)
WBC: 10.8 10*3/uL — ABNORMAL HIGH (ref 4.0–10.5)

## 2013-08-17 LAB — BASIC METABOLIC PANEL
BUN: 16 mg/dL (ref 6–23)
CO2: 22 meq/L (ref 19–32)
Calcium: 8.8 mg/dL (ref 8.4–10.5)
Chloride: 103 mEq/L (ref 96–112)
Creatinine, Ser: 0.91 mg/dL (ref 0.50–1.35)
GFR calc Af Amer: >90 mL/min (ref >90)
Glucose, Bld: 98 mg/dL (ref 70–99)
Potassium: 3.4 mEq/L — ABNORMAL LOW (ref 3.7–5.3)
SODIUM: 140 meq/L (ref 137–147)

## 2013-08-17 LAB — PHOSPHORUS: Phosphorus: 3.6 mg/dL (ref 2.3–4.6)

## 2013-08-17 LAB — MAGNESIUM: MAGNESIUM: 1.9 mg/dL (ref 1.5–2.5)

## 2013-08-17 MED ORDER — POTASSIUM CHLORIDE CRYS ER 20 MEQ PO TBCR: 20.0000 meq | EXTENDED_RELEASE_TABLET | Freq: Two times a day (BID) | ORAL | Status: DC

## 2013-08-17 MED ORDER — POTASSIUM CHLORIDE CRYS ER 20 MEQ PO TBCR: 40.0000 meq | EXTENDED_RELEASE_TABLET | Freq: Two times a day (BID) | ORAL | Status: DC

## 2013-08-17 MED ORDER — POTASSIUM CHLORIDE CRYS ER 20 MEQ PO TBCR: 20.0000 meq | EXTENDED_RELEASE_TABLET | Freq: Once | ORAL | Status: DC

## 2013-08-17 MED ORDER — POTASSIUM CHLORIDE CRYS ER 20 MEQ PO TBCR
40.0000 meq | EXTENDED_RELEASE_TABLET | Freq: Once | ORAL | Status: AC
  Administered 2013-08-17: 40 meq via ORAL
  Filled 2013-08-17: qty 2

## 2013-08-17 MED ORDER — FUROSEMIDE 10 MG/ML IJ SOLN
20.0000 mg | Freq: Once | INTRAMUSCULAR | Status: AC
  Administered 2013-08-17: 20 mg via INTRAVENOUS
  Filled 2013-08-17: qty 2

## 2013-08-17 MED ORDER — POTASSIUM CHLORIDE CRYS ER 20 MEQ PO TBCR
40.0000 meq | EXTENDED_RELEASE_TABLET | Freq: Three times a day (TID) | ORAL | Status: AC
  Administered 2013-08-17 (×2): 40 meq via ORAL
  Filled 2013-08-17 (×2): qty 2

## 2013-08-17 NOTE — Progress Notes (Addendum)
NUTRITION FOLLOW UP  Intervention:   No nutrition intervention at this time --- patient declined RD to follow for nutrition care plan  Nutrition Dx:   Inadequate oral intake related to limited appetite as evidenced by pt report, ongoing   New Goal:   Pt to meet >/= 90% of their estimated nutrition needs, progressing  Monitor:   PO & supplemental intake, weight, labs, I/O's  Assessment:   58 year old presented to the ER with approximately 1 week of abdominal and back pain. He had been seen by his PCP and treated with muscle relaxers and narcotics without relief. HE reports no aggravating or relieving factors.   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   Patient s/p procedures 4/21:  ENDOVASCULAR REPAIR OF ABDOMINAL AORTIC ANEURYSM  Patient extubated 4/25.  Vital HP formula D/C'd with extubation.  S/p bedside swallow evaluation.  SLP recommending Dysphagia 3, thin liquids.  Patient reports a fair appetite.  PO intake ~ 50% this AM.  Declined addition of nutrition supplements at this time.  Height: Ht Readings from Last 1 Encounters:  08/09/13 6\' 1"  (1.854 m)    Weight Status:   Wt Readings from Last 1 Encounters:  08/17/13 188 lb 4.4 oz (85.4 kg)    Body mass index is 24.84 kg/(m^2).  Re-estimated needs:  Kcal: 2150-2350 Protein: 120-130 gm Fluid: 2.1-2.3 L  Skin: Intact  Diet Order: Cardiac   Intake/Output Summary (Last 24 hours) at 08/17/13 1150 Last data filed at 08/17/13 0800  Gross per 24 hour  Intake     50 ml  Output   1875 ml  Net  -1825 ml    Labs:   Recent Labs Lab 08/15/13 0350 08/16/13 0520 08/17/13 0318  NA 140 142 140  K 3.6* 3.3* 3.4*  CL 104 100 103  CO2 25 26 22   BUN 15 17 16   CREATININE 0.86 0.81 0.91  CALCIUM 8.5 8.6 8.8  MG 2.1 2.0 1.9  PHOS 3.1 4.4 3.6  GLUCOSE 120* 101* 98    CBG (last 3)   Recent Labs  08/17/13 0016 08/17/13 0351 08/17/13 0729  GLUCAP 105* 91 80    Scheduled Meds: . antiseptic  oral rinse  15 mL Mouth Rinse QID  . aspirin  81 mg Per Tube Daily  . atorvastatin  10 mg Oral q1800  . cefTRIAXone (ROCEPHIN)  IV  1 g Intravenous Q24H  . chlorhexidine  15 mL Mouth Rinse BID  . diltiazem  30 mg Oral 4 times per day  . enoxaparin (LOVENOX) injection  40 mg Subcutaneous Q24H  . fentaNYL  25 mcg Intravenous Once  . folic acid  1 mg Oral Daily   Or  . folic acid  1 mg Intravenous Daily  . insulin aspart  1-3 Units Subcutaneous 6 times per day  . LORazepam  1 mg Intravenous Once  . multivitamin with minerals  1 tablet Oral Daily  . pantoprazole  40 mg Oral Daily  . potassium chloride  40 mEq Oral TID  . thiamine  100 mg Oral Daily   Or  . thiamine IV  100 mg Intravenous Daily    Continuous Infusions: . sodium chloride 20 mL/hr at 08/15/13 1700  . esmolol Stopped (08/15/13 1153)    Maureen ChattersKatie Arrionna Serena, RD, LDN Pager #: [phone] After-Hours Pager #: [phone]

## 2013-08-17 NOTE — Progress Notes (Signed)
    Subjective  - POD #6  Extubated Saturday Amnestic to post-op events Ambulated yesterday.  2 person assist  Physical Exam:  abd soft Alert and oriented       Assessment/Plan:  POD #6  CV:  cardizem PO for AFIB, now in sinus ID:  Continue rocephin for pneumonia and poss mycotic aneurysm Prophylaxis:  lovenox GU:  D/c foley today Transfer to step down PT/OT   Nada LibmanVance W [place] 08/17/2013 7:00 AM --  Ceasar MonsFiled Vitals:   08/17/13 0500  BP: 131/84  Pulse: 108  Temp:   Resp: 27    Intake/Output Summary (Last 24 hours) at 08/17/13 0700 Last data filed at 08/17/13 0500  Gross per 24 hour  Intake  113.4 ml  Output   2225 ml  Net -2111.6 ml     Laboratory CBC    Component Value Date/Time   WBC 10.8* 08/17/2013 0318   HGB 11.7* 08/17/2013 0318   HCT 33.9* 08/17/2013 0318   PLT 356 08/17/2013 0318    BMET    Component Value Date/Time   NA 140 08/17/2013 0318   K 3.4* 08/17/2013 0318   CL 103 08/17/2013 0318   CO2 22 08/17/2013 0318   GLUCOSE 98 08/17/2013 0318   BUN 16 08/17/2013 0318   CREATININE 0.91 08/17/2013 0318   CALCIUM 8.8 08/17/2013 0318   GFRNONAA >90 08/17/2013 0318   GFRAA >90 08/17/2013 0318    COAG Lab Results  Component Value Date   INR 1.02 08/11/2013   INR 1.03 08/10/2013   No results found for this basename: PTT    Antibiotics Anti-infectives   Start     Dose/Rate Route Frequency Ordered Stop   08/15/13 1400  cefTRIAXone (ROCEPHIN) 1 g in dextrose 5 % 50 mL IVPB     1 g 100 mL/hr over 30 Minutes Intravenous Every 24 hours 08/15/13 1242     08/14/13 1700  levofloxacin (LEVAQUIN) IVPB 750 mg  Status:  Discontinued     750 mg 100 mL/hr over 90 Minutes Intravenous Every 24 hours 08/14/13 1502 08/15/13 1242   08/13/13 1900  vancomycin (VANCOCIN) 1,250 mg in sodium chloride 0.9 % 250 mL IVPB  Status:  Discontinued     1,250 mg 166.7 mL/hr over 90 Minutes Intravenous Every 8 hours 08/13/13 0915 08/14/13 1332   08/13/13 1030  vancomycin  (VANCOCIN) 1,500 mg in sodium chloride 0.9 % 500 mL IVPB     1,500 mg 250 mL/hr over 120 Minutes Intravenous  Once 08/13/13 0915 08/13/13 1205   08/13/13 1030  piperacillin-tazobactam (ZOSYN) IVPB 3.375 g  Status:  Discontinued     3.375 g 12.5 mL/hr over 240 Minutes Intravenous Every 8 hours 08/13/13 0915 08/14/13 1332   08/11/13 1730  vancomycin (VANCOCIN) IVPB 1000 mg/200 mL premix     1,000 mg 200 mL/hr over 60 Minutes Intravenous Every 12 hours 08/11/13 1729 08/12/13 0600   08/11/13 1400  levofloxacin (LEVAQUIN) IVPB 500 mg     500 mg 100 mL/hr over 60 Minutes Intravenous To Surgery 08/11/13 1357 08/11/13 1400   08/11/13 0600  vancomycin (VANCOCIN) 1,500 mg in sodium chloride 0.9 % 500 mL IVPB     1,500 mg 250 mL/hr over 120 Minutes Intravenous On call 08/10/13 1648 08/11/13 0807       V. Charlena CrossWells Amaurie Wandel IV, M.D. [facility] [place] Office: [phone] Pager:  [phone]

## 2013-08-17 NOTE — Progress Notes (Signed)
Speech Language Pathology Treatment: Dysphagia  Patient Details Name: Walter Morgan MRN: [id number] DOB: 1955-10-08 Today's Date: 08/17/2013 Time: 2527-1292 SLP Time Calculation (min): 12 min  Assessment / Plan / Recommendation Clinical Impression  Pt seen for f/u dysphagia treatment with PO trials of Dys 3 textures and thin liquids via straw. Per chart review, note that pt's diet has been advanced to regular textures. Pt consumed PO trials with no overt s/s of aspiration and utilized safe swallowing strategies with Mod I. Recommend to continue with regular textures and thin liquids. No further SLP f/u for dysphagia is indicated at this time.   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 His partner reports that he has been a daily drinker for some time, and has had experiences like this before. Also states, however that he may not have had a drink for 2 weeks. He presented to the ER 4/19 with approximately 1 week of abdominal and back pain. He had been seen bby his PCP and treated with muscle relaxers and narcotics without relief. He reported no aggravating or relieving factors. 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 He had surgical repair of the aneurysm 4/21. Postoperatively he was combative with staff and disoriented. He was intubated from 4/22 to 4/25 in am.    Pertinent Vitals N/A  SLP Plan  All goals met    Recommendations Diet recommendations: Regular;Thin liquid Liquids provided via: Cup;Straw Medication Administration: Whole meds with liquid (as tolerated) Supervision: Patient able to self feed;Intermittent supervision to cue for compensatory strategies Compensations: Slow rate;Small sips/bites Postural Changes and/or Swallow Maneuvers: Seated upright 90 degrees;Upright 30-60 min after meal     Oral Care Recommendations: Oral care BID Follow up Recommendations: None Plan: All goals met    GO      Germain Osgood,  M.A. CCC-SLP [phone]  Germain Osgood 08/17/2013, 11:56 AM

## 2013-08-17 NOTE — Evaluation (Signed)
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 Pt underwent endovascular repair on 08/11/13. Post-op delirium probably due to alcohol withdrawal and required vent. Pt with history of CVA without residuals.  Clinical Impression  Pt admitted with above. Pt currently with functional limitations due to the deficits listed below (see PT Problem List).  Pt will benefit from skilled PT to increase their independence and safety with mobility to allow discharge to the venue listed below. Pt states that his sisters who have come into town can stay and assist at dc since his significant other will be at work during the day. If family can't provide this 24 hour assist pt may need to consider other post-acute options.      Follow Up Recommendations Home health PT;Supervision/Assistance - 24 hour    Equipment Recommendations  Rolling walker with 5" wheels    Recommendations for Other Services       Precautions / Restrictions Precautions Precautions: Fall      Mobility  Bed Mobility Overal bed mobility: Needs Assistance Bed Mobility: Sit to Sidelying         Sit to sidelying: Mod assist General bed mobility comments: Assist to lower trunk and bring legs up.  Transfers Overall transfer level: Needs assistance Equipment used: Rolling walker (2 wheeled) Transfers: Sit to/from Stand Sit to Stand: Mod assist         General transfer comment: Assist to bring hips up and verbal cues for hand placement.  Ambulation/Gait Ambulation/Gait assistance: Min assist Ambulation Distance (Feet): 180 Feet Assistive device: Rolling walker (2 wheeled) Gait Pattern/deviations: Step-through pattern;Decreased step length - right;Decreased step length - left Gait velocity: decr Gait velocity interpretation: Below normal speed for  age/gender General Gait Details: Pt tremulous with heavy reliance on UE's. Pt required multiple standing rest breaks.  Stairs            Wheelchair Mobility    Modified Rankin (Stroke Patients Only)       Balance Overall balance assessment: Needs assistance Sitting-balance support: No upper extremity supported;Feet supported Sitting balance-Leahy Scale: Fair     Standing balance support: Bilateral upper extremity supported Standing balance-Leahy Scale: Poor Standing balance comment: walker for UE support.                             Pertinent Vitals/Pain HR 130's    Home Living Family/patient expects to be discharged to:: Private residence Living Arrangements: Spouse/significant other (male partner) Available Help at Discharge: Family;Available 24 hours/day (sisters visiting and per pt can stay and assist at dc.) Type of Home: House Home Access: Stairs to enter Entrance Stairs-Rails: Right Entrance Stairs-Number of Steps: 4 Home Layout: Two level;Full bath on main level;Able to live on main level with bedroom/bathroom Home Equipment: None      Prior Function Level of Independence: Independent               Hand Dominance        Extremity/Trunk Assessment   Upper Extremity Assessment: Generalized weakness           Lower Extremity Assessment: Generalized weakness         Communication   Communication: No difficulties  Cognition Arousal/Alertness: Awake/alert Behavior During Therapy: WFL for tasks assessed/performed Overall Cognitive Status: Within Functional Limits for tasks assessed  General Comments      Exercises        Assessment/Plan    PT Assessment Patient needs continued PT services  PT Diagnosis Difficulty walking;Generalized weakness   PT Problem List Decreased strength;Decreased activity tolerance;Decreased balance;Decreased mobility;Decreased knowledge of use of DME  PT  Treatment Interventions DME instruction;Gait training;Stair training;Functional mobility training;Therapeutic activities;Therapeutic exercise;Balance training;Patient/family education   PT Goals (Current goals can be found in the Care Plan section) Acute Rehab PT Goals Patient Stated Goal: Return home PT Goal Formulation: With patient Time For Goal Achievement: 08/24/13 Potential to Achieve Goals: Good    Frequency Min 3X/week   Barriers to discharge        Co-evaluation               End of Session Equipment Utilized During Treatment: Gait belt Activity Tolerance: Patient limited by fatigue Patient left: in bed;with call bell/phone within reach;with nursing/sitter in room           Time: 0843-0902 PT Time Calculation (min): 19 min   Charges:   PT Evaluation $Initial PT Evaluation Tier I: 1 Procedure PT Treatments $Gait Training: 8-22 mins   PT G CodesAngelina Ok:          Aira Sallade W [place] M[place]ia 08/17/2013, 9:31 AM  Skip Mayerary Vonda Harth PT [phone]

## 2013-08-17 NOTE — Progress Notes (Signed)
PULMONARY / CRITICAL CARE MEDICINE   Name: Walter Morgan MRN: [id number] DOB: [birth year]    ADMISSION DATE:  08/09/2013 CONSULTATION DATE:  08/11/2013  REFERRING MD :  Myra GianottiBrabham PRIMARY SERVICE: Vasc Surgery  CHIEF COMPLAINT:  Altered mental status s/p AAA repair  BRIEF PATIENT DESCRIPTION: 58 year old current smoker with 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 His partner reports that he has been a daily drinker for some time, and has had experiences like this before. Also states, however that he may not have had a drink for 2 weeks. He presented to the ER 4/19 with approximately 1 week of abdominal and back pain. He had been seen bby his PCP and treated with muscle relaxers and narcotics without relief. He reported no aggravating or relieving factors. 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 He had surgical repair of the aneurysm 4/21. Postoperatively he was combative with staff and disoriented. He was started on precedex infusion with some improvement. 4/21 PM he remains agitated and screaming at staff, trying to climb out of bed. PCCM asked to see for help managing delirium.    LINES / TUBES: Left Radial Art Line 4/21 (accuracy uncertain)>>>out RIJ CVL 4/21 >>>4/26 Foley 4/21 >>>4/25 ETT 4/22 >>4/25 OG tube 4/22 >>4/25  CULTURES: Blood 4/19 >>>NTD Trach aspirate 4/23>>>GNR Urine 4/23 >>NTD  ANTIBIOTICS: Vanc 4/19 >>>4/24 Zosyn start date unknown>>>4/24 Levaquin 4/24>>>  SIGNIFICANT EVENTS / STUDIES:  4/21 CT abodmoen >>> Abnormal retroperitoneal stranding surrounding a focal dissection and possible intramural hematoma along a minimally aneurysmal segment of the infrarenal abdominal aorta. 3.3 x 2.8cm. 4/21 AAA repair  08/12/13: On precedex 1.2, esmolol and just got ativan 2mg : RASS +3 with all this  08/13/13: On 30mcg dripivan and 150mcg fentanyl. Got very agitated on WUA. Occ will follow commands. Febrile again. Not on pressors but on esmolol.    SUBJECTIVE/OVERNIGHT/INTERVAL HX Extubated and feels much better, no complaints.  VITAL SIGNS: Temp:  [97.7 F (36.5 C)-99.5 F (37.5 C)] 97.7 F (36.5 C) (04/27 0732) Pulse Rate:  [96-111] 111 (04/27 1000) Resp:  [12-27] 17 (04/27 1000) BP: (130-154)/(62-94) 130/69 mmHg (04/27 1000) SpO2:  [90 %-98 %] 95 % (04/27 1000) Weight:  [188 lb 4.4 oz (85.4 kg)] 188 lb 4.4 oz (85.4 kg) (04/27 0700) HEMODYNAMICS:   VENTILATOR SETTINGS:   INTAKE / OUTPUT: Intake/Output     04/26 0701 - 04/27 0700 04/27 0701 - 04/28 0700   I.V. (mL/kg) 63.4 (0.7)    NG/GT     IV Piggyback 50    Total Intake(mL/kg) 113.4 (1.3)    Urine (mL/kg/hr) 2225 (1.1) 125 (0.3)   Stool     Total Output 2225 125   Net -2111.6 -125        Stool Occurrence 2 x     PHYSICAL EXAMINATION: General:  Alert, oriented and interactive.  Neuro:  Alert, oriented. Follow commands. HEENT:  [place]/AT, PERRL Cardiovascular:  RRR, Nl S1/S2, -M/R/G. Lungs: Clear anteriorly Abdomen:  Soft, non-distended Musculoskeletal:  No acute deformity/ROM limitations Skin:  Intact  LABS:  PULMONARY  Recent Labs Lab 08/11/13 1806 08/11/13 2334 08/12/13 1241  PHART 7.434  --  7.349*  PCO2ART 29.0*  --  38.7  PO2ART 54.0*  --  188.0*  HCO3 19.4*  --  21.2  TCO2 20  --  22  O2SAT 89.0 31.5 100.0   CBC  Recent Labs Lab 08/15/13 0350 08/16/13 0520 08/17/13 0318  HGB 10.6* 11.6* 11.7*  HCT 31.1* 32.8*  33.9*  WBC 9.7 11.6* 10.8*  PLT 280 340 356   COAGULATION  Recent Labs Lab 08/11/13 1640  INR 1.02   CARDIAC    Recent Labs Lab 08/13/13 0922 08/13/13 1654 08/14/13 0245  TROPONINI <0.30 <0.30 <0.30    Recent Labs Lab 08/14/13 0245 08/15/13 0350  PROBNP 1092.0* 401.5*   CHEMISTRY  Recent Labs Lab 08/13/13 0425 08/14/13 0245  08/15/13 0350 08/16/13 0520 08/17/13 0318  NA 140 136*  --  140 142 140  K 4.0 3.1*  < > 3.6* 3.3* 3.4*  CL 107 101  --  104 100 103  CO2 19 22  --  25 26 22   GLUCOSE 77  98  --  120* 101* 98  BUN 17 16  --  15 17 16   CREATININE 0.89 0.84  --  0.86 0.81 0.91  CALCIUM 8.0* 8.1*  --  8.5 8.6 8.8  MG 2.1 1.9  --  2.1 2.0 1.9  PHOS 2.5 3.4  --  3.1 4.4 3.6  < > = values in this interval not displayed. Estimated Creatinine Clearance: 103.7 ml/min (by C-G formula based on Cr of 0.91).  LIVER  Recent Labs Lab 08/11/13 1640  INR 1.02   INFECTIOUS  Recent Labs Lab 08/12/13 0030 08/12/13 1130 08/13/13 0425 08/14/13 0245  LATICACIDVEN 1.5  --   --  0.9  PROCALCITON  --  0.37 0.36 0.36   ENDOCRINE CBG (last 3)   Recent Labs  08/17/13 0016 08/17/13 0351 08/17/13 0729  GLUCAP 105* 91 80   IMAGING x48h  Dg Chest Port 1 View  08/16/2013   CLINICAL DATA:  Evaluate central line position  EXAM: PORTABLE CHEST - 1 VIEW  COMPARISON:  Prior chest x-ray 08/15/2013  FINDINGS: Interval extubation and removal of nasogastric tube. The right IJ approach central venous catheter as significantly pulled back and is nearly displaced. The tip is likely just within the internal jugular vein. Improved pulmonary aeration. Inspiratory volumes are greater and there is decreased bibasilar atelectasis and interstitial edema. Cardiac and mediastinal contours are within normal limits. No pneumothorax or effusion.  IMPRESSION: 1. The right IJ approach central venous catheter has pulled back and is nearly extravascular. The tip is likely just within the internal jugular vein. 2. Interval extubation and removal of nasogastric tube. 3. Significant interval improvement in pulmonary aeration with greater inspiratory volumes, resolved interstitial edema and resolved bibasilar atelectasis. These results were called by telephone at the time of interpretation on 08/16/2013 at 7:40 AM to the patient's nurse, Devonne Doughty, who verbally acknowledged these results.   Electronically Signed   By: Malachy Moan M.D.   On: 08/16/2013 07:40   ASSESSMENT / PLAN:  NEUROLOGIC A:   Delirium - could be  related to ETOH withdrawal as he has been reported a daily drinker by his partner. He has been calling out from the room "take me to the bar, I'll sober up there", so withdrawal remains a likley etiology.  - severe delirium persists.  Etoh abuse. P:   - Ativan prn. - D/Ced Precedex.  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  HTN  - s/p nipride for 12h  - now on esmolol and weaning off. In NSR but when agitated apparently in A Fib. CXR looks wet P:  - ECHO - EF 60-65%. - D/Ced esmolol and HR is stable. - D/Ced IV cardizem and continue PO.  PULMONARY A: VDRF since 08/13/13 due to acute encephalopath Very  agitated and will not be able to wean calmly. P:   - Extubated and doing well. - Titrate O2 to off.  GI A: No active issues. P - Heart healthy diet.  RENAL A: Normal. Mag and K less P - D/Ced lasix, auto diuresing at this point. - Replace electrolytes as indicated. - BMET in AM.  HEME A: Anemia of critical illness P PRBC for hgb < 7gm%  ID A: spiking fevers, Tracheal aspirate and cxr suggests likely pneumonia P - GNR in sputum, speciation pending. - Changed from vanc/zosyn to levaquin due to rash, recommend completion of an 8 day course.  Ready for D/C out of the ICU from PCCM standpoint, PCCM will sign off, please call back if needed.  Alyson ReedyWesam G. Madasyn Heath, M.D. [facility]. Pager: [phone]. After hours pager: [phone].

## 2013-08-18 LAB — GLUCOSE, CAPILLARY
GLUCOSE-CAPILLARY: 102 mg/dL — AB (ref 70–99)
GLUCOSE-CAPILLARY: 95 mg/dL (ref 70–99)
GLUCOSE-CAPILLARY: 121 mg/dL — AB (ref 70–99)
Glucose-Capillary: 79 mg/dL (ref 70–99)
Glucose-Capillary: 92 mg/dL (ref 70–99)

## 2013-08-18 LAB — BASIC METABOLIC PANEL
BUN: 16 mg/dL (ref 6–23)
CALCIUM: 9.1 mg/dL (ref 8.4–10.5)
CO2: 22 mEq/L (ref 19–32)
CREATININE: 1.02 mg/dL (ref 0.50–1.35)
Chloride: 104 mEq/L (ref 96–112)
GFR, EST NON AFRICAN AMERICAN: 81 mL/min — AB (ref >90)
Glucose, Bld: 107 mg/dL — ABNORMAL HIGH (ref 70–99)
Potassium: 4 mEq/L (ref 3.7–5.3)
Sodium: 141 mEq/L (ref 137–147)

## 2013-08-18 LAB — CBC
HEMATOCRIT: 37.6 % — AB (ref 39.0–52.0)
Hemoglobin: 12.6 g/dL — ABNORMAL LOW (ref 13.0–17.0)
MCH: 31.5 pg (ref 26.0–34.0)
MCHC: 33.5 g/dL (ref 30.0–36.0)
MCV: 94 fL (ref 78.0–100.0)
PLATELETS: 414 10*3/uL — AB (ref 150–400)
RBC: 4 MIL/uL — ABNORMAL LOW (ref 4.22–5.81)
RDW: 11.8 % (ref 11.5–15.5)
WBC: 10.4 10*3/uL (ref 4.0–10.5)

## 2013-08-18 LAB — CULTURE, BLOOD (ROUTINE X 2)
CULTURE: NO GROWTH
Culture: NO GROWTH

## 2013-08-18 LAB — PHOSPHORUS: PHOSPHORUS: 4.2 mg/dL (ref 2.3–4.6)

## 2013-08-18 LAB — MAGNESIUM: Magnesium: 2.2 mg/dL (ref 1.5–2.5)

## 2013-08-18 NOTE — Progress Notes (Addendum)
Subjective  - POD #7  Trouble sleeping last night, some abdominal pain Walked with PT using a wheelchair for support Tolerating PO 6 laps yesterday   Physical Exam:  Groin incisions soft Palpable pedal pulses Abdomen soft, essentially non-tender       Assessment/Plan:  POD #7  AFIB:  On cardizem, has been in NSR for 2 days GI:  abd pain improving ID:  Trach asp grew E.Coli.  con't IV ABX given allergy profile Prophylaxis:  lovenox Lytes ok today H/H stable Dispo:  ? Home with HH/PT vs rehab, 1-2 days    Nada LibmanVance W Merisa Julio 08/18/2013 10:20 AM --  Ceasar MonsFiled Vitals:   08/18/13 0800  BP: 134/87  Pulse: 105  Temp:   Resp: 21    Intake/Output Summary (Last 24 hours) at 08/18/13 1020 Last data filed at 08/18/13 0800  Gross per 24 hour  Intake    300 ml  Output   1500 ml  Net  -1200 ml     Laboratory CBC    Component Value Date/Time   WBC 10.4 08/18/2013 0256   HGB 12.6* 08/18/2013 0256   HCT 37.6* 08/18/2013 0256   PLT 414* 08/18/2013 0256    BMET    Component Value Date/Time   NA 141 08/18/2013 0256   K 4.0 08/18/2013 0256   CL 104 08/18/2013 0256   CO2 22 08/18/2013 0256   GLUCOSE 107* 08/18/2013 0256   BUN 16 08/18/2013 0256   CREATININE 1.02 08/18/2013 0256   CALCIUM 9.1 08/18/2013 0256   GFRNONAA 81* 08/18/2013 0256   GFRAA >90 08/18/2013 0256    COAG Lab Results  Component Value Date   INR 1.02 08/11/2013   INR 1.03 08/10/2013   No results found for this basename: PTT    Antibiotics Anti-infectives   Start     Dose/Rate Route Frequency Ordered Stop   08/15/13 1400  cefTRIAXone (ROCEPHIN) 1 g in dextrose 5 % 50 mL IVPB     1 g 100 mL/hr over 30 Minutes Intravenous Every 24 hours 08/15/13 1242     08/14/13 1700  levofloxacin (LEVAQUIN) IVPB 750 mg  Status:  Discontinued     750 mg 100 mL/hr over 90 Minutes Intravenous Every 24 hours 08/14/13 1502 08/15/13 1242   08/13/13 1900  vancomycin (VANCOCIN) 1,250 mg in sodium chloride 0.9 % 250 mL IVPB   Status:  Discontinued     1,250 mg 166.7 mL/hr over 90 Minutes Intravenous Every 8 hours 08/13/13 0915 08/14/13 1332   08/13/13 1030  vancomycin (VANCOCIN) 1,500 mg in sodium chloride 0.9 % 500 mL IVPB     1,500 mg 250 mL/hr over 120 Minutes Intravenous  Once 08/13/13 0915 08/13/13 1205   08/13/13 1030  piperacillin-tazobactam (ZOSYN) IVPB 3.375 g  Status:  Discontinued     3.375 g 12.5 mL/hr over 240 Minutes Intravenous Every 8 hours 08/13/13 0915 08/14/13 1332   08/11/13 1730  vancomycin (VANCOCIN) IVPB 1000 mg/200 mL premix     1,000 mg 200 mL/hr over 60 Minutes Intravenous Every 12 hours 08/11/13 1729 08/12/13 0600   08/11/13 1400  levofloxacin (LEVAQUIN) IVPB 500 mg     500 mg 100 mL/hr over 60 Minutes Intravenous To Surgery 08/11/13 1357 08/11/13 1400   08/11/13 0600  vancomycin (VANCOCIN) 1,500 mg in sodium chloride 0.9 % 500 mL IVPB     1,500 mg 250 mL/hr over 120 Minutes Intravenous On call 08/10/13 1648 08/11/13 0807       V.  Charlena CrossWells Yasin Ducat IV, M.D. [facility] [place] [facility] Office: [phone] Pager:  [phone]

## 2013-08-18 NOTE — Plan of Care (Signed)
Problem: Phase I Progression Outcomes Goal: Initial discharge plan identified Outcome: Completed/Met Date Met:  08/18/13 Home with friend

## 2013-08-18 NOTE — Progress Notes (Signed)
PT Cancellation Note  Patient Details Name: Walter Morgan MRN: [id number] DOB: [birth year]   Cancelled Treatment:    Reason Eval/Treat Not Completed: Other (comment) (Pt had just finished amb.)   [name] [facility] 08/18/2013, 3:05 PM

## 2013-08-19 ENCOUNTER — Other Ambulatory Visit: Payer: Self-pay | Admitting: *Deleted

## 2013-08-19 DIAGNOSIS — Z48812 Encounter for surgical aftercare following surgery on the circulatory system: Secondary | ICD-10-CM

## 2013-08-19 DIAGNOSIS — I714 Abdominal aortic aneurysm, without rupture, unspecified: Secondary | ICD-10-CM

## 2013-08-19 LAB — GLUCOSE, CAPILLARY
GLUCOSE-CAPILLARY: 109 mg/dL — AB (ref 70–99)
GLUCOSE-CAPILLARY: 111 mg/dL — AB (ref 70–99)
GLUCOSE-CAPILLARY: 91 mg/dL (ref 70–99)
Glucose-Capillary: 98 mg/dL (ref 70–99)

## 2013-08-19 MED ORDER — FOLIC ACID 1 MG PO TABS: 1.0000 mg | ORAL_TABLET | Freq: Every day | ORAL | Status: DC

## 2013-08-19 MED ORDER — DILTIAZEM HCL 30 MG PO TABS: 30.0000 mg | ORAL_TABLET | Freq: Four times a day (QID) | ORAL | Status: DC

## 2013-08-19 MED ORDER — HYDROCODONE-ACETAMINOPHEN 7.5-325 MG PO TABS: 1.0000 | ORAL_TABLET | Freq: Four times a day (QID) | ORAL | Status: DC | PRN

## 2013-08-19 MED ORDER — CEFUROXIME AXETIL 250 MG PO TABS: 250.0000 mg | ORAL_TABLET | Freq: Two times a day (BID) | ORAL | Status: DC

## 2013-08-19 NOTE — Discharge Summary (Signed)
I agree with the above  Walter Morgan 

## 2013-08-19 NOTE — Progress Notes (Signed)
[facility] Progress Note  08/19/2013 7:56 AM 8 Days Post-Op  Subjective:  Feels better today.  C/o soreness in lower back.  Has been up walking already today.  Tm 99 now 97.4 HR 90's-100's regular 100's-150's systolic 100% RA  Filed Vitals:   08/19/13 0602  BP: 105/53  Pulse: 107  Temp: 97.4 F (36.3 C)  Resp: 18    Physical Exam: Incisions:  Bilateral groins are soft without hematoma Extremities:  1+ DP bilaterally Abdomen:  Soft, NT/ND  CBC    Component Value Date/Time   WBC 10.4 08/18/2013 0256   RBC 4.00* 08/18/2013 0256   HGB 12.6* 08/18/2013 0256   HCT 37.6* 08/18/2013 0256   PLT 414* 08/18/2013 0256   MCV 94.0 08/18/2013 0256   MCH 31.5 08/18/2013 0256   MCHC 33.5 08/18/2013 0256   RDW 11.8 08/18/2013 0256   LYMPHSABS 1.8 08/17/2013 0318   MONOABS 1.1* 08/17/2013 0318   EOSABS 0.0 08/17/2013 0318   BASOSABS 0.0 08/17/2013 0318    BMET    Component Value Date/Time   NA 141 08/18/2013 0256   K 4.0 08/18/2013 0256   CL 104 08/18/2013 0256   CO2 22 08/18/2013 0256   GLUCOSE 107* 08/18/2013 0256   BUN 16 08/18/2013 0256   CREATININE 1.02 08/18/2013 0256   CALCIUM 9.1 08/18/2013 0256   GFRNONAA 81* 08/18/2013 0256   GFRAA >90 08/18/2013 0256    INR    Component Value Date/Time   INR 1.02 08/11/2013 1640     Intake/Output Summary (Last 24 hours) at 08/19/13 0756 Last data filed at 08/18/13 1828  Gross per 24 hour  Intake    720 ml  Output    650 ml  Net     70 ml     Assessment:  58 y.o. male is s/p:  #1: Endovascular repair of abdominal aortic aneurysm  #2: Catheter in aorta x2  #3: Abdominal aortogram  #4: Bilateral ultrasound guided common femoral artery access  8 Days Post-Op  Plan: -pt doing well this am -continues to be in sinus rhythm > 48 hours on Cardizem  -doing well-most likely home today as he has much support and is ambulating well. -continue Cardizem at home for post op Afib.  Will have him f/u with PCP in 2 weeks. -DVT  prophylaxis:  Lovenox -spoke with IP pharmacist and since pt has been on Ceftriaxone here, he should tolerate Ceftin.  Will discharge with Ceftin 250 mg bid x 5 days to total 10 days of ABx tx for E coli in trach aspirate.   Doreatha MassedSamantha Rhyne, PA-C [facility] [phone] 08/19/2013 7:56 AM

## 2013-08-19 NOTE — Progress Notes (Signed)
I agree with the above 5 days of PO abx Wells Zanaria Morell

## 2013-08-19 NOTE — Discharge Summary (Signed)
[facility] Discharge Summary  Walter PerfectBart Chadderdon May 14, 1955 58 y.o. male  [id number]  Admission Date: 08/09/2013  Discharge Date: 08/19/13  Physician: Nada LibmanVance W Brabham, MD  Admission Diagnosis: Aortic dissection [441.00]   HPI:   This is a 58 y.o. male presented to the ER tonight with approximately 1 week of abdominal and back pain. He had been seen bby his PCP and treated with muscle relaxers and narcotics without relief. HE reports no aggravating or relieving factors. 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 Patient reports subjective fevers at home. He has been constipated. Stool softners and suppositories have not worked. He is a smoker. He has a history of a stroke about 10 years ago secondary to lifestyle and stress. He has no residual deficits. He takes a statin for hypercholesterolemia. He has early diabetes but is not on any medications.   Hospital Course:  The patient was admitted to the hospital and taken to the operating room on 08/09/2013 and underwent: #1: Endovascular repair of abdominal aortic aneurysm  #2: Catheter in aorta x2  #3: Abdominal aortogram  #4: Bilateral ultrasound guided common femoral artery access    He was extubated in the operating room.  The pt tolerated the procedure well and was transported to the PACU in good condition. In the PACU, the pt was requiring several staff members to keep the pt in bed and safe.  He did require Precedex and Ativan.  He was also requiring Nitroprusside, which was changed to esmolol.  On POD 3, he did require re-intubation for respiratory insufficiency. He did go into atrial fib on POD 3 and his rate was controlled with esmolol.  Esmolol was weaned and he was placed on a cardizem gtt, which was eventually converted to po.  He did receive a dose of lasix for volume overload/CHF on CXR and his BNP was elevated. He did have a good response to the lasix. Tube feeds were also started.  Pt  did become febrile and tracheal aspirate was sent off and came back as E coli.  He was then placed on empiric ABx while waiting for the final culture.  On POD 5, the vancomycin and zosyn were discontinued and he was started on Levaquin for GNR.  Once final results were back, the culture was sensitive to Rocephin and his ABx were changed.  At discharge, inpatient pharmacist recommended Ceftin for discharge for 5 days for total of 10 days of antibiotics since he was tolerating the Rocephin with his PCN allergy.  He was extubated on POD 5.  He was transferred to telemetry floor on POD 6.  His ambulation continued to greatly improve on POD 6 and 7.  He is discharged on POD 8.  An echo was performed on 08/14/13 and the results are as follows: Study Conclusions  Left ventricle: The cavity size was normal. Wall thickness was increased in a pattern of mild LVH. Systolic function was normal. The estimated ejection fraction was in the range of 60% to 65%. Wall motion was normal; there were no regional wall motion abnormalities.  His post operative delirium could be related to etoh withdrawals as he was reported to be a daily drinker.    The remainder of the hospital course consisted of increasing mobilization and increasing intake of solids without difficulty.  CBC    Component Value Date/Time   WBC 10.4 08/18/2013 0256   RBC 4.00* 08/18/2013 0256   HGB 12.6* 08/18/2013 0256  HCT 37.6* 08/18/2013 0256   PLT 414* 08/18/2013 0256   MCV 94.0 08/18/2013 0256   MCH 31.5 08/18/2013 0256   MCHC 33.5 08/18/2013 0256   RDW 11.8 08/18/2013 0256   LYMPHSABS 1.8 08/17/2013 0318   MONOABS 1.1* 08/17/2013 0318   EOSABS 0.0 08/17/2013 0318   BASOSABS 0.0 08/17/2013 0318    BMET    Component Value Date/Time   NA 141 08/18/2013 0256   K 4.0 08/18/2013 0256   CL 104 08/18/2013 0256   CO2 22 08/18/2013 0256   GLUCOSE 107* 08/18/2013 0256   BUN 16 08/18/2013 0256   CREATININE 1.02 08/18/2013 0256   CALCIUM 9.1  08/18/2013 0256   GFRNONAA 81* 08/18/2013 0256   GFRAA >90 08/18/2013 0256     Discharge Instructions:   The patient is discharged to home with extensive instructions on wound care and progressive ambulation.  They are instructed not to drive or perform any heavy lifting until returning to see the physician in his office.  Discharge Orders   Future Orders Complete By Expires   ABDOMINAL PROCEDURE/ANEURYSM REPAIR/AORTO-BIFEMORAL BYPASS:  Call MD for increased abdominal pain; cramping diarrhea; nausea/vomiting  As directed    Call MD for:  redness, tenderness, or signs of infection (pain, swelling, bleeding, redness, odor or green/yellow discharge around incision site)  As directed    Call MD for:  severe or increased pain, loss or decreased feeling  in affected limb(s)  As directed    Call MD for:  temperature >100.5  As directed    Discharge wound care:  As directed    Driving Restrictions  As directed    Lifting restrictions  As directed    Resume previous diet  As directed       Discharge Diagnosis:  Aortic dissection [441.00]  Secondary Diagnosis: Patient Active Problem List   Diagnosis Date Noted  . Acute respiratory failure with hypoxia 08/13/2013  . Encephalopathy acute 08/12/2013  . AAA (abdominal aortic aneurysm) 08/11/2013  . Dissection of aorta, abdominal 08/09/2013   Past Medical History  Diagnosis Date  . GERD (gastroesophageal reflux disease)   . Diabetes     possibly early stage, not formally diagnosed- MD wanted to start on metformin- is not taking per patient   . H/O hiatal hernia     repaired       Medication List         ALIGN 4 MG Caps  Take 4 mg by mouth daily.     aspirin 81 MG tablet  Take 81 mg by mouth daily.     cefUROXime 250 MG tablet  Commonly known as:  CEFTIN  Take 1 tablet (250 mg total) by mouth 2 (two) times daily with a meal.     CO Q 10 PO  Take 1 capsule by mouth daily.     CRESTOR 5 MG tablet  Generic drug:  rosuvastatin   Take 5 mg by mouth daily.     cyclobenzaprine 10 MG tablet  Commonly known as:  FLEXERIL  Take 10 mg by mouth 3 (three) times daily as needed for muscle spasms.     diltiazem 30 MG tablet  Commonly known as:  CARDIZEM  Take 1 tablet (30 mg total) by mouth every 6 (six) hours.     HYDROcodone-acetaminophen 7.5-325 MG per tablet  Commonly known as:  NORCO  Take 1 tablet by mouth every 6 (six) hours as needed for moderate pain.     ibuprofen 800 MG tablet  Commonly known as:  ADVIL,MOTRIN  Take 800 mg by mouth 4 (four) times daily.     lansoprazole 30 MG capsule  Commonly known as:  PREVACID  Take 30 mg by mouth daily.     LORazepam 1 MG tablet  Commonly known as:  ATIVAN  Take 1 mg by mouth 2 (two) times daily as needed.     mometasone 50 MCG/ACT nasal spray  Commonly known as:  NASONEX  Place 2 sprays into the nose daily as needed (allergies).     multivitamin with minerals tablet  Take 1 tablet by mouth daily.     traMADol 50 MG tablet  Commonly known as:  ULTRAM  Take 50 mg by mouth every 4 (four) hours.        vicodin #30 No Refill  Disposition: home with HHPT  Patient's condition: is Good  Follow up: 1. Dr. Myra Gianotti in 4 weeks with CTA   Doreatha Massed, PA-C [facility] [phone] 08/19/2013  12:47 PM   - For VQI Registry use --- Instructions: Press F2 to tab through selections.  Delete question if not applicable.   Post-op:  Time to Extubation: [ x] In OR, [ ]  < 12 hrs, [ ]  12-24 hrs, [ ]  >=24 hrs Vasopressors Req. Post-op: Yes MI: no, [ ]  Troponin only, [ ]  EKG or Clinical New Arrhythmia: Yes-Afib CHF: Yes ICU Stay: 6 days Transfusion: No  If yes, n/a units given  Complications: Resp failure: yes, [ x] Pneumonia, [x ] Ventilator Chg in renal function: no, [ ]  Inc. Cr > 0.5, [ ]  Temp. Dialysis, [ ]  Permanent dialysis Leg ischemia: no, no Surgery needed, [ ]  Yes, Surgery needed, [ ]  Amputation Bowel ischemia: no, [ ]   Medical Rx, [ ]  Surgical Rx Wound complication: no, [ ]  Superficial separation/infection, [ ]  Return to OR Return to OR: No  Return to OR for bleeding: No Stroke: no, [ ]  Minor, [ ]  Major  Discharge medications: Statin use:  Yes If No: [ ]  For Medical reasons, [ ]  Non-compliant, [ ]  Not-indicated ASA use:  Yes  If No: [ ]  For Medical reasons, [ ]  Non-compliant, [ ]  Not-indicated Plavix use:  No If No: [ ]  For Medical reasons, [ ]  Non-compliant, [ ]  Not-indicated Beta blocker use:  No If No: [ ]  For Medical reasons, [ ]  Non-compliant, [ ]  Not-indicated

## 2013-08-25 ENCOUNTER — Telehealth: Payer: Self-pay | Admitting: Surgery

## 2013-08-25 NOTE — Telephone Encounter (Addendum)
Message copied by Fredrich BirksMILLIKAN, DANA P on Tue Aug 25, 2013  3:52 PM ------      Message from: Lorin MercyMCCHESNEY, MARILYN K      Created: Wed Aug 19, 2013  8:40 AM      Regarding: Schedule                   ----- Message -----         From: Dara LordsSamantha J Rhyne, PA-C         Sent: 08/19/2013   8:18 AM           To: [facility] Charge Pool            S/p [facility].  F/u in 4 weeks with CTA protocol.            Thanks,      Samantha ------  08/25/13: spoke with pt, dpm

## 2013-09-18 ENCOUNTER — Encounter: Payer: Self-pay | Admitting: Surgery

## 2013-09-21 ENCOUNTER — Encounter: Payer: Self-pay | Admitting: Surgery

## 2013-09-21 ENCOUNTER — Ambulatory Visit (INDEPENDENT_AMBULATORY_CARE_PROVIDER_SITE_OTHER): Payer: Self-pay | Admitting: Surgery

## 2013-09-21 ENCOUNTER — Ambulatory Visit
Admission: RE | Admit: 2013-09-21 | Discharge: 2013-09-21 | Disposition: A | Discharge status: Home / Self Care | Payer: BC Managed Care – PPO | Source: Ambulatory Visit | Attending: Surgery | Admitting: Surgery

## 2013-09-21 VITALS — BP 135/84 | HR 101 | Ht 73.0 in | Wt 188.0 lb

## 2013-09-21 DIAGNOSIS — I714 Abdominal aortic aneurysm, without rupture, unspecified: Secondary | ICD-10-CM

## 2013-09-21 DIAGNOSIS — Z48812 Encounter for surgical aftercare following surgery on the circulatory system: Secondary | ICD-10-CM

## 2013-09-21 MED ORDER — IOHEXOL 350 MG/ML SOLN
80.0000 mL | Freq: Once | INTRAVENOUS | Status: AC | PRN
  Administered 2013-09-21: 80 mL via INTRAVENOUS

## 2013-09-21 NOTE — Addendum Note (Signed)
Addended by: Sharee Pimple on: 09/21/2013 05:07 PM   Modules accepted: Orders

## 2013-09-21 NOTE — Progress Notes (Signed)
The patient is here today for his first postoperative visit.  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  On 08/11/2013 he underwent endovascular repair.  His postoperative course was complicated by aggression which required reintubation.  He ultimately made a full recovery and was discharged to home.  He has been doing very well account.  On examination both groin incisions are healing nicely.  I have reviewed his CT scan which shows resolution of the inflammatory component associated with his penetrating ulcer.  There has also been a decrease in the size of the aneurysm.  I have scheduled the patient for followup visit in 6 months with an ultrasound.  He was sent home on diltiazem.  He is back in a regular rhythm and therefore I discontinued this medication.

## 2014-03-17 ENCOUNTER — Encounter: Payer: Self-pay | Admitting: Surgery

## 2014-03-22 ENCOUNTER — Ambulatory Visit (INDEPENDENT_AMBULATORY_CARE_PROVIDER_SITE_OTHER): Payer: BC Managed Care – PPO | Admitting: Family

## 2014-03-22 ENCOUNTER — Ambulatory Visit (HOSPITAL_COMMUNITY)
Admission: RE | Admit: 2014-03-22 | Discharge: 2014-03-22 | Disposition: A | Discharge status: Home / Self Care | Payer: BC Managed Care – PPO | Source: Ambulatory Visit | Attending: Family | Admitting: Family

## 2014-03-22 ENCOUNTER — Encounter: Payer: Self-pay | Admitting: Family

## 2014-03-22 VITALS — BP 124/84 | HR 73 | Resp 16 | Ht 73.0 in | Wt 199.0 lb

## 2014-03-22 DIAGNOSIS — Z48812 Encounter for surgical aftercare following surgery on the circulatory system: Secondary | ICD-10-CM | POA: Diagnosis present

## 2014-03-22 DIAGNOSIS — I714 Abdominal aortic aneurysm, without rupture, unspecified: Secondary | ICD-10-CM

## 2014-03-22 NOTE — Patient Instructions (Signed)
Smoking Cessation Quitting smoking is important to your health and has many advantages. However, it is not always easy to quit since nicotine is a very addictive drug. Oftentimes, people try 3 times or more before being able to quit. This document explains the best ways for you to prepare to quit smoking. Quitting takes hard work and a lot of effort, but you can do it. ADVANTAGES OF QUITTING SMOKING  You will live longer, feel better, and live better.  Your body will feel the impact of quitting smoking almost immediately.  Within 20 minutes, blood pressure decreases. Your pulse returns to its normal level.  After 8 hours, carbon monoxide levels in the blood return to normal. Your oxygen level increases.  After 24 hours, the chance of having a heart attack starts to decrease. Your breath, hair, and body stop smelling like smoke.  After 48 hours, damaged nerve endings begin to recover. Your sense of taste and smell improve.  After 72 hours, the body is virtually free of nicotine. Your bronchial tubes relax and breathing becomes easier.  After 2 to 12 weeks, lungs can hold more air. Exercise becomes easier and circulation improves.  The risk of having a heart attack, stroke, cancer, or lung disease is greatly reduced.  After 1 year, the risk of coronary heart disease is cut in half.  After 5 years, the risk of stroke falls to the same as a nonsmoker.  After 10 years, the risk of lung cancer is cut in half and the risk of other cancers decreases significantly.  After 15 years, the risk of coronary heart disease drops, usually to the level of a nonsmoker.  If you are pregnant, quitting smoking will improve your chances of having a healthy baby.  The people you live with, especially any children, will be healthier.  You will have extra money to spend on things other than cigarettes. QUESTIONS TO THINK ABOUT BEFORE ATTEMPTING TO QUIT You may want to talk about your answers with your  health care provider.  Why do you want to quit?  If you tried to quit in the past, what helped and what did not?  What will be the most difficult situations for you after you quit? How will you plan to handle them?  Who can help you through the tough times? Your family? Friends? A health care provider?  What pleasures do you get from smoking? What ways can you still get pleasure if you quit? Here are some questions to ask your health care provider:  How can you help me to be successful at quitting?  What medicine do you think would be best for me and how should I take it?  What should I do if I need more help?  What is smoking withdrawal like? How can I get information on withdrawal? GET READY  Set a quit date.  Change your environment by getting rid of all cigarettes, ashtrays, matches, and lighters in your home, car, or work. Do not let people smoke in your home.  Review your past attempts to quit. Think about what worked and what did not. GET SUPPORT AND ENCOURAGEMENT You have a better chance of being successful if you have help. You can get support in many ways.  Tell your family, friends, and coworkers that you are going to quit and need their support. Ask them not to smoke around you.  Get individual, group, or telephone counseling and support. Programs are available at local hospitals and health centers. Call   your local health department for information about programs in your area.  Spiritual beliefs and practices may help some smokers quit.  Download a "quit meter" on your computer to keep track of quit statistics, such as how long you have gone without smoking, cigarettes not smoked, and money saved.  Get a self-help book about quitting smoking and staying off tobacco. Soham yourself from urges to smoke. Talk to someone, go for a walk, or occupy your time with a task.  Change your normal routine. Take a different route to work.  Drink tea instead of coffee. Eat breakfast in a different place.  Reduce your stress. Take a hot bath, exercise, or read a book.  Plan something enjoyable to do every day. Reward yourself for not smoking.  Explore interactive web-based programs that specialize in helping you quit. GET MEDICINE AND USE IT CORRECTLY Medicines can help you stop smoking and decrease the urge to smoke. Combining medicine with the above behavioral methods and support can greatly increase your chances of successfully quitting smoking.  Nicotine replacement therapy helps deliver nicotine to your body without the negative effects and risks of smoking. Nicotine replacement therapy includes nicotine gum, lozenges, inhalers, nasal sprays, and skin patches. Some may be available over-the-counter and others require a prescription.  Antidepressant medicine helps people abstain from smoking, but how this works is unknown. This medicine is available by prescription.  Nicotinic receptor partial agonist medicine simulates the effect of nicotine in your brain. This medicine is available by prescription. Ask your health care provider for advice about which medicines to use and how to use them based on your health history. Your health care provider will tell you what side effects to look out for if you choose to be on a medicine or therapy. Carefully read the information on the package. Do not use any other product containing nicotine while using a nicotine replacement product.  RELAPSE OR DIFFICULT SITUATIONS Most relapses occur within the first 3 months after quitting. Do not be discouraged if you start smoking again. Remember, most people try several times before finally quitting. You may have symptoms of withdrawal because your body is used to nicotine. You may crave cigarettes, be irritable, feel very hungry, cough often, get headaches, or have difficulty concentrating. The withdrawal symptoms are only temporary. They are strongest  when you first quit, but they will go away within 10-14 days. To reduce the chances of relapse, try to:  Avoid drinking alcohol. Drinking lowers your chances of successfully quitting.  Reduce the amount of caffeine you consume. Once you quit smoking, the amount of caffeine in your body increases and can give you symptoms, such as a rapid heartbeat, sweating, and anxiety.  Avoid smokers because they can make you want to smoke.  Do not let weight gain distract you. Many smokers will gain weight when they quit, usually less than 10 pounds. Eat a healthy diet and stay active. You can always lose the weight gained after you quit.  Find ways to improve your mood other than smoking. FOR MORE INFORMATION  www.smokefree.gov  Document Released: 04/03/2001 Document Revised: 08/24/2013 Document Reviewed: 07/19/2011 [facility] Patient Information 2015 [place], [place]. This information is not intended to replace advice given to you by your health care provider. Make sure you discuss any questions you have with your health care provider.    Smoking Cessation, Tips for Success If you are ready to quit smoking, congratulations! You have chosen to help yourself  be healthier. Cigarettes bring nicotine, tar, carbon monoxide, and other irritants into your body. Your lungs, heart, and blood vessels will be able to work better without these poisons. There are many different ways to quit smoking. Nicotine gum, nicotine patches, a nicotine inhaler, or nicotine nasal spray can help with physical craving. Hypnosis, support groups, and medicines help break the habit of smoking. WHAT THINGS CAN I DO TO MAKE QUITTING EASIER?  Here are some tips to help you quit for good:  Pick a date when you will quit smoking completely. Tell all of your friends and family about your plan to quit on that date.  Do not try to slowly cut down on the number of cigarettes you are smoking. Pick a quit date and quit smoking completely starting  on that day.  Throw away all cigarettes.   Clean and remove all ashtrays from your home, work, and car.  On a card, write down your reasons for quitting. Carry the card with you and read it when you get the urge to smoke.  Cleanse your body of nicotine. Drink enough water and fluids to keep your urine clear or pale yellow. Do this after quitting to flush the nicotine from your body.  Learn to predict your moods. Do not let a bad situation be your excuse to have a cigarette. Some situations in your life might tempt you into wanting a cigarette.  Never have "just one" cigarette. It leads to wanting another and another. Remind yourself of your decision to quit.  Change habits associated with smoking. If you smoked while driving or when feeling stressed, try other activities to replace smoking. Stand up when drinking your coffee. Brush your teeth after eating. Sit in a different chair when you read the paper. Avoid alcohol while trying to quit, and try to drink fewer caffeinated beverages. Alcohol and caffeine may urge you to smoke.  Avoid foods and drinks that can trigger a desire to smoke, such as sugary or spicy foods and alcohol.  Ask people who smoke not to smoke around you.  Have something planned to do right after eating or having a cup of coffee. For example, plan to take a walk or exercise.  Try a relaxation exercise to calm you down and decrease your stress. Remember, you may be tense and nervous for the first 2 weeks after you quit, but this will pass.  Find new activities to keep your hands busy. Play with a pen, coin, or rubber band. Doodle or draw things on paper.  Brush your teeth right after eating. This will help cut down on the craving for the taste of tobacco after meals. You can also try mouthwash.   Use oral substitutes in place of cigarettes. Try using lemon drops, carrots, cinnamon sticks, or chewing gum. Keep them handy so they are available when you have the urge to  smoke.  When you have the urge to smoke, try deep breathing.  Designate your home as a nonsmoking area.  If you are a heavy smoker, ask your health care provider about a prescription for nicotine chewing gum. It can ease your withdrawal from nicotine.  Reward yourself. Set aside the cigarette money you save and buy yourself something nice.  Look for support from others. Join a support group or smoking cessation program. Ask someone at home or at work to help you with your plan to quit smoking.  Always ask yourself, "Do I need this cigarette or is this just a reflex?"  Tell yourself, "Today, I choose not to smoke," or "I do not want to smoke." You are reminding yourself of your decision to quit.  Do not replace cigarette smoking with electronic cigarettes (commonly called e-cigarettes). The safety of e-cigarettes is unknown, and some may contain harmful chemicals.  If you relapse, do not give up! Plan ahead and think about what you will do the next time you get the urge to smoke. HOW WILL I FEEL WHEN I QUIT SMOKING? You may have symptoms of withdrawal because your body is used to nicotine (the addictive substance in cigarettes). You may crave cigarettes, be irritable, feel very hungry, cough often, get headaches, or have difficulty concentrating. The withdrawal symptoms are only temporary. They are strongest when you first quit but will go away within 10-14 days. When withdrawal symptoms occur, stay in control. Think about your reasons for quitting. Remind yourself that these are signs that your body is healing and getting used to being without cigarettes. Remember that withdrawal symptoms are easier to treat than the major diseases that smoking can cause.  Even after the withdrawal is over, expect periodic urges to smoke. However, these cravings are generally short lived and will go away whether you smoke or not. Do not smoke! WHAT RESOURCES ARE AVAILABLE TO HELP ME QUIT SMOKING? Your health care  provider can direct you to community resources or hospitals for support, which may include:  Group support.  Education.  Hypnosis.  Therapy. Document Released: 01/06/2004 Document Revised: 08/24/2013 Document Reviewed: 09/25/2012 [facility] Patient Information 2015 [place], [place]. This information is not intended to replace advice given to you by your health care provider. Make sure you discuss any questions you have with your health care provider.   Abdominal Aortic Aneurysm Endograft Repair Abdominal aortic aneurysm endograft repair is a procedure to fix an abdominal aortic aneurysm. An aneurysm is a weakened or damaged part of an artery wall that bulges out from the normal force of blood pumping through the body. An abdominal aortic aneurysm is an aneurysm that occurs in the lower part of the aorta, the main artery of the body.  The repair procedure is often done if the aneurysm gets so large that it might burst (rupture). A ruptured aneurysm would cause bleeding inside the body that is life threatening. Before that happens, this procedure is needed to fix the condition. The procedure may also be done if the aneurysm causes symptoms such as pain in the back, abdomen, or side. In this procedure, a tube made up of fabric supported by a metal mesh (endograft or stent-graft) is placed in the weakened part of the aorta to repair the area. This procedure may take 1-3 hours.  LET [facility] PROVIDER KNOW ABOUT:  Any allergies you have.   All medicines you are taking, including vitamins, herbs, eye drops, creams, and over-the-counter medicines.  Anysteroids you are using (by mouth or as creams).   Previous problems you or members of your family have had with the use of anesthetics.   Any blood disorders or history of blood clots.   Previous surgeries you have had.   Other health problems you have.  Possibility of pregnancy, if this applies.  RISKS AND COMPLICATIONS Generally,  endograft repair of an abdominal aortic aneurysm is a safe procedure. However, as with any surgical procedure, complications can occur. Possible complications include:  Leaking of blood around the endograft.  Infection.  Damage to surrounding nerves, tissues, or structures.  Displacement of the endograft away  from the proper location.   Blockage of blood flow through the graft.  Blood clots.   Kidney problems.  Blockage of blood flow to the legs (rare).  Rupture of the aorta even after successful endograft repair (rare).  BEFORE THE PROCEDURE   You may need to have blood tests, a test to check heart rhythm (electrocardiography), or a test to check blood flow (angiography) done before the day of the procedure.  Imaging tests will be done to help determine the size and location of the aneurysm. This could include ultrasonography, a CT scan, or an MRI.  Ask your health care provider about changing or stopping your regular medicines.  Do not eat or drink anything for at least 8 hours before the procedure. Ask your health care provider if it is okay to take any needed medicines with a sip of water.  Do not smoke for as long as possible before the surgery.  Make plans to have someone drive you home after your hospital stay. PROCEDURE   You will be given medicine to help you relax (sedative). You may also be given medicine to make you sleep through the procedure (general anesthetic) or medicine to numb the affected area of the body (local orregional anesthetic). Medicine may be given through an intravenous (IV) access tube that is put into one of your veins.  The groin area will be washed and shaved.   Small cuts (incisions) are usually made on both sides of the groin. Long, thin tubes (catheters) are passed through these incisions, inserted into the artery in your thigh, and moved up into the aneurysm in the aorta.   The health care provider uses live X-ray pictures to  guide the endograft through the catheterto the site of the aneurysm.  The endograft is released to seal off the aneurysm and line the aorta. It prevents blood from flowing into the aneurysm and helps prevent it from rupturing. The placement of the endograft is permanent.  X-rays are used to check the position of the endograft and confirm proper placement.  The catheters are taken out, and the incisions are closed with stitches. AFTER THE PROCEDURE   You will need to lie flat for several hours. Bending the leg that has the insertion site can cause it to bleed and swell.  You will then be encouraged to move around several times a day and to gradually increase activity.   Your blood pressure and pulse (vital signs) will be checked often.  You will receive medicines to control pain.  Certain tests may be done to check the function and location of the endograft after your procedure.  You will need to stay in the hospital for about 1-4 days.  Document Released: 08/26/2008 Document Revised: 12/10/2012 Document Reviewed: 08/29/2012 [facility] Patient Information 2015 [place], [place]. This information is not intended to replace advice given to you by your health care provider. Make sure you discuss any questions you have with your health care provider.

## 2014-03-22 NOTE — Progress Notes (Signed)
[facility] [place]  Established EVAR  History of Present Illness  Walter Morgan is a 58 y.o. (1955/09/07) male patient of Dr. Myra GianottiBrabham who presents for routine follow up s/p EVAR. 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 On 08/11/2013 he underwent endovascular repair. He has very mild intermitent low back pain that is relieved by heat or ice application; this was evaluated by his PCP and imaging and some "wear and tear" was found in his lumbar vertebrae.   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 Pt states he [facility], pt states this was normal. He takes a daily 81 mg ASA, daily statin, no other antiplatelets nor anticoagulants.  He reports tingling in the tips of fingers of both hands at times, denies pain or weakness in upper extremities.  The pt denies claudication symptoms, he states he was diagnosed with tendonitis in his knees and his calves hurt at times after walking several miles; he walks 2-3 miles daily.  Pt Diabetic: No Pt smoker: smoker  (1/3 ppd, started in his 7330's)   Past Medical History  Diagnosis Date  . GERD (gastroesophageal reflux disease)   . Diabetes     possibly early stage, not formally diagnosed- MD wanted to start on metformin- is not taking per patient   . H/O hiatal hernia     repaired  . AAA (abdominal aortic aneurysm)   . Stroke 2006    Mild stroke   Past Surgical History  Procedure Laterality Date  . Appendectomy    . Cholecystectomy    . Hernia repair    . Abdominal aortic endovascular stent graft N/A 08/11/2013    Procedure: ABDOMINAL AORTIC ENDOVASCULAR STENT GRAFT- GORE;  Surgeon: Nada LibmanVance W Brabham, MD;  [facility];  Service:  Vascular;  Laterality: N/A;   Social History History  Substance Use Topics  . Smoking status: Light Tobacco Smoker -- 0.25 packs/day    Types: Cigarettes  . Smokeless tobacco: Never Used  . Alcohol Use: 1.2 oz/week    2 Shots of liquor per week     Comment: social   Family History Family History  Problem Relation Age of Onset  . Cancer Mother     Lung  . Varicose Veins Mother   . Heart attack Father   . Heart disease Father     Before age 56  . Hyperlipidemia Father   . Heart disease Sister 30    Heart Attack- Before age 56  . Hyperlipidemia Sister   . Heart attack Sister    Current Outpatient Prescriptions on File Prior to Visit  Medication Sig Dispense Refill  . aspirin 81 MG tablet Take 81 mg by mouth daily.    . Coenzyme Q10 (CO Q 10 PO) Take 1 capsule by mouth daily.    . CRESTOR 5 MG tablet Take 5 mg by mouth daily.     . cyclobenzaprine (FLEXERIL) 10 MG tablet Take 10 mg by mouth 3 (three) times daily as needed for muscle spasms.     Marland Kitchen. ibuprofen (ADVIL,MOTRIN) 800 MG tablet Take 800 mg by mouth 4 (four) times daily.     . lansoprazole (PREVACID) 30 MG capsule Take 30 mg by mouth daily.     .Marland Kitchen  LORazepam (ATIVAN) 1 MG tablet Take 1 mg by mouth 2 (two) times daily as needed.     . mometasone (NASONEX) 50 MCG/ACT nasal spray Place 2 sprays into the nose daily as needed (allergies).    . Multiple Vitamins-Minerals (MULTIVITAMIN WITH MINERALS) tablet Take 1 tablet by mouth daily.    . Probiotic Product (ALIGN) 4 MG CAPS Take 4 mg by mouth daily.    . cefUROXime (CEFTIN) 250 MG tablet Take 1 tablet (250 mg total) by mouth 2 (two) times daily with a meal. (Patient not taking: Reported on 03/22/2014) 10 tablet 0  . diltiazem (CARDIZEM) 30 MG tablet Take 1 tablet (30 mg total) by mouth every 6 (six) hours. (Patient not taking: Reported on 03/22/2014) 120 tablet 2  . HYDROcodone-acetaminophen (NORCO) 7.5-325 MG per tablet Take 1 tablet by mouth every 6 (six) hours as needed for  moderate pain. (Patient not taking: Reported on 03/22/2014) 30 tablet 0  . traMADol (ULTRAM) 50 MG tablet Take 50 mg by mouth every 4 (four) hours.      No current facility-administered medications on file prior to visit.   Allergies  Allergen Reactions  . Antihistamines, Chlorpheniramine-Type Anaphylaxis    All anithistamines   . Benzonatate Other (See Comments)    tounge swelling   . Other Itching    Darvocet  . Penicillins Nausea And Vomiting     ROS: See HPI for pertinent positives and negatives.  Physical Examination  Filed Vitals:   03/22/14 0913  BP: 124/84  Pulse: 73  Resp: 16  Height: 6\' 1"  (1.854 m)  Weight: 199 lb (90.266 kg)  SpO2: 96%    General: A&O x 3, WD.  Pulmonary: Sym exp, good air movt, CTAB, no rales, rhonchi, or wheezing.   Cardiac: RRR, Nl S1, S2, no murmur appreciated.  Vascular: Vessel Right Left  Radial 2+Palpable 1+Palpable  Carotid audible without bruit  audible without bruit  Aorta Not palpable N/A  Femoral 2+Palpable 2+Palpable  Popliteal Not palpable Not palpable  PT 2+Palpable 2+Palpable  DP notPalpable notPalpable   Gastrointestinal: soft, NTND, -G/R, - HSM, - masses palpated, - CVAT B.  Musculoskeletal: M/S 5/5 throughout, Extremities without ischemic changes.  Neurologic: Pain and light touch intact in extremities, Motor exam as listed above  Non-Invasive Vascular Imaging  EVAR Duplex (Date: 03/22/2014)) ABDOMINAL AORTA DUPLEX EVALUATION - POST ENDOVASCULAR REPAIR    INDICATION: Evaluation of endovascular abdominal repair of aortic aneurysm.    PREVIOUS INTERVENTION(S): EVAR 08/11/2013.    DUPLEX EXAM:      DIAMETER AP (cm) DIAMETER TRANSVERSE (cm) VELOCITIES (cm/sec)  Aorta 3.09 3.04 70  Right Common Iliac 1.02  100  Left Common Iliac 1.04 1.12 123    Comparison Study       Date DIAMETER AP (cm) DIAMETER TRANSVERSE (cm)         ADDITIONAL FINDINGS:     IMPRESSION: Patent endovascular abdominal aortic  repair with a maximum diameter of 3.09 x 3.04 cm.    Compared to the previous exam:  No prior exam performed at this facility for comparison.      Medical Decision Making   Ardeth PerfectBart Wieneke is a 58 y.o. male who presents s/p EVAR (08/11/2013).  Pt is asymptomatic.  Today's EVAR Duplex reveals a patent endovascular abdominal aortic repair with a maximum diameter of 3.09 x 3.04 cm.  The patient's blood pressure is well controlled, but unfortunately he continues to smoke; the patient was counseled re smoking cessation and given several free  resources re smoking cessation.   I discussed with the patient the importance of surveillance of the endograft.  The next endograft duplex will be scheduled for 6 months.  The patient will follow up with [place] in 6 months with these studies.  I emphasized the importance of maximal medical management including strict control of blood pressure, blood glucose, and lipid levels, antiplatelet agents, obtaining regular exercise, and cessation of smoking.   The patient was given information about AAA including signs, symptoms, treatment, and how to minimize the risk of enlargement and rupture of aneurysms.    Thank you for allowing [place] to participate in this patient's care.  Charisse March, RN, MSN, FNP-C [facility] [place] Office: [phone]  Clinic Physician: Myra Gianotti  03/22/2014, 9:15 AM

## 2014-09-27 ENCOUNTER — Other Ambulatory Visit (HOSPITAL_COMMUNITY): Payer: BC Managed Care – PPO

## 2014-09-27 ENCOUNTER — Ambulatory Visit: Payer: BC Managed Care – PPO | Admitting: Family

## 2014-09-29 ENCOUNTER — Encounter: Payer: Self-pay | Admitting: Family

## 2014-10-04 ENCOUNTER — Ambulatory Visit (HOSPITAL_COMMUNITY)
Admission: RE | Admit: 2014-10-04 | Discharge: 2014-10-04 | Disposition: A | Discharge status: Home / Self Care | Payer: BLUE CROSS/BLUE SHIELD | Source: Ambulatory Visit | Attending: Family | Admitting: Family

## 2014-10-04 ENCOUNTER — Encounter: Payer: Self-pay | Admitting: Family

## 2014-10-04 ENCOUNTER — Ambulatory Visit (INDEPENDENT_AMBULATORY_CARE_PROVIDER_SITE_OTHER): Payer: BLUE CROSS/BLUE SHIELD | Admitting: Family

## 2014-10-04 VITALS — BP 129/88 | HR 90 | Resp 16 | Ht 73.0 in | Wt 195.0 lb

## 2014-10-04 DIAGNOSIS — Z95828 Presence of other vascular implants and grafts: Secondary | ICD-10-CM | POA: Diagnosis not present

## 2014-10-04 DIAGNOSIS — Z720 Tobacco use: Secondary | ICD-10-CM | POA: Diagnosis not present

## 2014-10-04 DIAGNOSIS — Z48812 Encounter for surgical aftercare following surgery on the circulatory system: Secondary | ICD-10-CM | POA: Diagnosis not present

## 2014-10-04 DIAGNOSIS — I714 Abdominal aortic aneurysm, without rupture, unspecified: Secondary | ICD-10-CM

## 2014-10-04 DIAGNOSIS — F17200 Nicotine dependence, unspecified, uncomplicated: Secondary | ICD-10-CM

## 2014-10-04 NOTE — Progress Notes (Signed)
[facility] [place]  Established EVAR  History of Present Illness  Walter Morgan is a 59 y.o. ([birth year]) male patient of Dr. Myra Gianotti who presents for routine follow up s/p EVAR. 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 On 08/11/2013 he underwent endovascular repair. He has very mild intermitent low back pain that is relieved by heat or ice application; this was evaluated by his PCP and imaging and some "wear and tear" was found in his lumbar vertebrae.  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 He no longer has the headaches. Pt states he had a Duplex of his carotid arteries at that point through [facility] in [facility], pt states this was normal. He takes a daily 81 mg ASA, daily statin, no other antiplatelets nor anticoagulants.  He reports tingling in the tips of fingers of both hands at times, denies pain or weakness in upper extremities.  The pt denies claudication symptoms, he states he was diagnosed with tendonitis in his knees and his calves hurt at times after walking several miles; he walks 2-3 miles daily.  Pt Diabetic: No Pt smoker: smoker (1/2 pack/week, started in his 46's)   Past Medical History  Diagnosis Date  . GERD (gastroesophageal reflux disease)   . Diabetes     possibly early stage, not formally diagnosed- MD wanted to start on metformin- is not taking per patient   . H/O hiatal hernia     repaired  . AAA (abdominal aortic aneurysm)   . Stroke 2006    Mild stroke   Past Surgical History  Procedure Laterality Date  . Appendectomy    . Cholecystectomy    . Hernia repair    . Abdominal aortic endovascular stent graft N/A 08/11/2013    Procedure: ABDOMINAL AORTIC ENDOVASCULAR STENT GRAFT- GORE;  Surgeon: Nada Libman, MD;  Location: [facility] OR;  Service: Vascular;  Laterality: N/A;   Social History History  Substance Use Topics  . Smoking status: Light Tobacco Smoker -- 0.25 packs/day    Types: Cigarettes  . Smokeless tobacco: Never Used  . Alcohol Use: 1.2 oz/week    2 Shots of liquor per week     Comment: social   Family History Family History  Problem Relation Age of Onset  . Cancer Mother     Lung  . Varicose Veins Mother   . Diabetes Mother   . Heart attack Father   . Heart disease Father     Before age 89  . Hyperlipidemia Father   . Heart disease Sister 30    Heart Attack- Before age 72-  PVD   . Hyperlipidemia Sister   . Heart attack Sister   . Cancer Sister     Breast  . Diabetes Sister   . Hyperlipidemia Brother   . Varicose Veins Brother    Current Outpatient Prescriptions on File Prior to Visit  Medication Sig Dispense Refill  . aspirin 81 MG tablet Take 81 mg by mouth daily.    . cefUROXime (CEFTIN) 250 MG tablet Take 1 tablet (250 mg total) by mouth 2 (two) times daily with a meal. 10 tablet 0  . Coenzyme Q10 (CO Q 10 PO) Take 1 capsule by mouth daily.    . CRESTOR 5 MG tablet Take 5 mg by mouth  daily.     . cyclobenzaprine (FLEXERIL) 10 MG tablet Take 10 mg by mouth 3 (three) times daily as needed for muscle spasms.     Marland Kitchen diltiazem (CARDIZEM) 30 MG tablet Take 1 tablet (30 mg total) by mouth every 6 (six) hours. 120 tablet 2  . HYDROcodone-acetaminophen (NORCO) 7.5-325 MG per tablet Take 1 tablet by mouth every 6 (six) hours as needed for moderate pain. 30 tablet 0  . lansoprazole (PREVACID) 30 MG capsule Take 30 mg by mouth daily.     Marland Kitchen LORazepam (ATIVAN) 1 MG tablet Take 1 mg by mouth 2 (two) times daily as needed.     . mometasone (NASONEX) 50 MCG/ACT nasal spray Place 2 sprays into the nose daily as needed (allergies).    . Multiple Vitamins-Minerals (MULTIVITAMIN WITH MINERALS) tablet Take 1 tablet by mouth daily.    . Probiotic Product (ALIGN) 4 MG CAPS Take 4  mg by mouth daily.    Marland Kitchen ibuprofen (ADVIL,MOTRIN) 800 MG tablet Take 800 mg by mouth 4 (four) times daily.     . traMADol (ULTRAM) 50 MG tablet Take 50 mg by mouth every 4 (four) hours.      No current facility-administered medications on file prior to visit.   Allergies  Allergen Reactions  . Antihistamines, Chlorpheniramine-Type Anaphylaxis    All anithistamines   . Benzonatate Other (See Comments)    tounge swelling   . Other Itching    Darvocet  . Penicillins Nausea And Vomiting     ROS: See HPI for pertinent positives and negatives.  Physical Examination  Filed Vitals:   10/04/14 0923  BP: 129/88  Pulse: 90  Resp: 16  Height: 6\' 1"  (1.854 m)  Weight: 195 lb (88.451 kg)  SpO2: 95%   Body mass index is 25.73 kg/(m^2).  General: A&O x 3, WD.  Pulmonary: Sym exp, good air movt, CTAB, no rales, rhonchi, or wheezing.   Cardiac: RRR, Nl S1, S2, no murmur appreciated.  Vascular: Vessel Right Left  Radial 2+Palpable 1+Palpable  Carotid audible without bruit audible without bruit  Aorta Not palpable N/A  Femoral 2+Palpable 2+Palpable  Popliteal Not palpable Not palpable  PT 2+Palpable 2+Palpable  DP notPalpable notPalpable   Gastrointestinal: soft, NTND, -G/R, - HSM, - masses palpated, - CVAT B.  Musculoskeletal: M/S 5/5 throughout, Extremities without ischemic changes.  Neurologic: Pain and light touch intact in extremities, Motor exam as listed above        Non-Invasive Vascular Imaging  EVAR Duplex (Date: 10/04/2014)  ABDOMINAL AORTA DUPLEX EVALUATION - POST ENDOVASCULAR REPAIR    INDICATION: Stent repair of abdominal aortic aneurysm     PREVIOUS INTERVENTION(S): Stent repair of abdominal aortic aneurysm on 08/11/13    DUPLEX EXAM:      DIAMETER AP (cm) DIAMETER TRANSVERSE (cm) VELOCITIES (cm/sec)  Aorta 2.9 2.7 54  Right Common Iliac 1.3 1.3 82  Left Common Iliac 1.2 1.4 100    Comparison Study  Ultrasound     Date  DIAMETER AP (cm) DIAMETER TRANSVERSE (cm)  03/22/14 3.09 3.04     ADDITIONAL FINDINGS: . Decreased visualization of the abdominal vasculature due to overlying bowel gas. . No evidence of extrastent flow noted within the aneurysmal sac.     IMPRESSION: Patent stent of the abdominal aorta with a maximum diameter measurement of 2.9cm.     Compared to the previous exam:  No significant change in the maximum diameter of the abdominal aorta when compared to the previous exam.  Medical Decision Making  Walter Morgan is a 59 y.o. male who presents s/p EVAR (Date: 08/11/13).  Pt is asymptomatic with stable sac size. He continues to smoke but has decreased use and is trying to quit. The patient was counseled re smoking cessation and given several free resources re smoking cessation.   I discussed with the patient the importance of surveillance of the endograft.  The next endograft duplex will be scheduled for 12 months.  The patient will follow up with [place] in 12 months with these studies.  I emphasized the importance of maximal medical management including strict control of blood pressure, blood glucose, and lipid levels, antiplatelet agents, obtaining regular exercise, and cessation of smoking.   The patient was given information about AAA including signs, symptoms, treatment, and how to minimize the risk of enlargement and rupture of aneurysms.    Thank you for allowing [place] to participate in this patient's care.  Charisse March, RN, MSN, FNP-C [facility] Office: [phone]  Clinic Physician: Myra Gianotti  10/04/2014, 9:38 AM

## 2014-10-04 NOTE — Addendum Note (Signed)
Addended by: Adria Dill L on: 10/04/2014 12:08 PM   Modules accepted: Orders

## 2014-10-04 NOTE — Patient Instructions (Signed)
Smoking Cessation  Quitting smoking is important to your health and has many advantages. However, it is not always easy to quit since nicotine is a very addictive drug. Oftentimes, people try 3 times or more before being able to quit. This document explains the best ways for you to prepare to quit smoking. Quitting takes hard work and a lot of effort, but you can do it.  ADVANTAGES OF QUITTING SMOKING  · You will live longer, feel better, and live better.  · Your body will feel the impact of quitting smoking almost immediately.  · Within 20 minutes, blood pressure decreases. Your pulse returns to its normal level.  · After 8 hours, carbon monoxide levels in the blood return to normal. Your oxygen level increases.  · After 24 hours, the chance of having a heart attack starts to decrease. Your breath, hair, and body stop smelling like smoke.  · After 48 hours, damaged nerve endings begin to recover. Your sense of taste and smell improve.  · After 72 hours, the body is virtually free of nicotine. Your bronchial tubes relax and breathing becomes easier.  · After 2 to 12 weeks, lungs can hold more air. Exercise becomes easier and circulation improves.  · The risk of having a heart attack, stroke, cancer, or lung disease is greatly reduced.  · After 1 year, the risk of coronary heart disease is cut in half.  · After 5 years, the risk of stroke falls to the same as a nonsmoker.  · After 10 years, the risk of lung cancer is cut in half and the risk of other cancers decreases significantly.  · After 15 years, the risk of coronary heart disease drops, usually to the level of a nonsmoker.  · If you are pregnant, quitting smoking will improve your chances of having a healthy baby.  · The people you live with, especially any children, will be healthier.  · You will have extra money to spend on things other than cigarettes.  QUESTIONS TO THINK ABOUT BEFORE ATTEMPTING TO QUIT  You may want to talk about your answers with your  health care provider.  · Why do you want to quit?  · If you tried to quit in the past, what helped and what did not?  · What will be the most difficult situations for you after you quit? How will you plan to handle them?  · Who can help you through the tough times? Your family? Friends? A health care provider?  · What pleasures do you get from smoking? What ways can you still get pleasure if you quit?  Here are some questions to ask your health care provider:  · How can you help me to be successful at quitting?  · What medicine do you think would be best for me and how should I take it?  · What should I do if I need more help?  · What is smoking withdrawal like? How can I get information on withdrawal?  GET READY  · Set a quit date.  · Change your environment by getting rid of all cigarettes, ashtrays, matches, and lighters in your home, car, or work. Do not let people smoke in your home.  · Review your past attempts to quit. Think about what worked and what did not.  GET SUPPORT AND ENCOURAGEMENT  You have a better chance of being successful if you have help. You can get support in many ways.  · Tell your family, friends, and   coworkers that you are going to quit and need their support. Ask them not to smoke around you.  · Get individual, group, or telephone counseling and support. Programs are available at local hospitals and health centers. Call your local health department for information about programs in your area.  · Spiritual beliefs and practices may help some smokers quit.  · Download a "quit meter" on your computer to keep track of quit statistics, such as how long you have gone without smoking, cigarettes not smoked, and money saved.  · Get a self-help book about quitting smoking and staying off tobacco.  LEARN NEW SKILLS AND BEHAVIORS  · Distract yourself from urges to smoke. Talk to someone, go for a walk, or occupy your time with a task.  · Change your normal routine. Take a different route to work.  Drink tea instead of coffee. Eat breakfast in a different place.  · Reduce your stress. Take a hot bath, exercise, or read a book.  · Plan something enjoyable to do every day. Reward yourself for not smoking.  · Explore interactive web-based programs that specialize in helping you quit.  GET MEDICINE AND USE IT CORRECTLY  Medicines can help you stop smoking and decrease the urge to smoke. Combining medicine with the above behavioral methods and support can greatly increase your chances of successfully quitting smoking.  · Nicotine replacement therapy helps deliver nicotine to your body without the negative effects and risks of smoking. Nicotine replacement therapy includes nicotine gum, lozenges, inhalers, nasal sprays, and skin patches. Some may be available over-the-counter and others require a prescription.  · Antidepressant medicine helps people abstain from smoking, but how this works is unknown. This medicine is available by prescription.  · Nicotinic receptor partial agonist medicine simulates the effect of nicotine in your brain. This medicine is available by prescription.  Ask your health care provider for advice about which medicines to use and how to use them based on your health history. Your health care provider will tell you what side effects to look out for if you choose to be on a medicine or therapy. Carefully read the information on the package. Do not use any other product containing nicotine while using a nicotine replacement product.   RELAPSE OR DIFFICULT SITUATIONS  Most relapses occur within the first 3 months after quitting. Do not be discouraged if you start smoking again. Remember, most people try several times before finally quitting. You may have symptoms of withdrawal because your body is used to nicotine. You may crave cigarettes, be irritable, feel very hungry, cough often, get headaches, or have difficulty concentrating. The withdrawal symptoms are only temporary. They are strongest  when you first quit, but they will go away within 10-14 days.  To reduce the chances of relapse, try to:  · Avoid drinking alcohol. Drinking lowers your chances of successfully quitting.  · Reduce the amount of caffeine you consume. Once you quit smoking, the amount of caffeine in your body increases and can give you symptoms, such as a rapid heartbeat, sweating, and anxiety.  · Avoid smokers because they can make you want to smoke.  · Do not let weight gain distract you. Many smokers will gain weight when they quit, usually less than 10 pounds. Eat a healthy diet and stay active. You can always lose the weight gained after you quit.  · Find ways to improve your mood other than smoking.  FOR MORE INFORMATION   www.smokefree.gov   Document Released:   04/03/2001 Document Revised: 08/24/2013 Document Reviewed: 07/19/2011  ExitCare® Patient Information ©2015 [employer]. This information is not intended to replace advice given to you by your health care provider. Make sure you discuss any questions you have with your health care provider.  Smoking Cessation, Tips for Success  If you are ready to quit smoking, congratulations! You have chosen to help yourself be healthier. Cigarettes bring nicotine, tar, carbon monoxide, and other irritants into your body. Your lungs, heart, and blood vessels will be able to work better without these poisons. There are many different ways to quit smoking. Nicotine gum, nicotine patches, a nicotine inhaler, or nicotine nasal spray can help with physical craving. Hypnosis, support groups, and medicines help break the habit of smoking.  WHAT THINGS CAN I DO TO MAKE QUITTING EASIER?   Here are some tips to help you quit for good:  · Pick a date when you will quit smoking completely. Tell all of your friends and family about your plan to quit on that date.  · Do not try to slowly cut down on the number of cigarettes you are smoking. Pick a quit date and quit smoking completely starting on  that day.  · Throw away all cigarettes.    · Clean and remove all ashtrays from your home, work, and car.  · On a card, write down your reasons for quitting. Carry the card with you and read it when you get the urge to smoke.  · Cleanse your body of nicotine. Drink enough water and fluids to keep your urine clear or pale yellow. Do this after quitting to flush the nicotine from your body.  · Learn to predict your moods. Do not let a bad situation be your excuse to have a cigarette. Some situations in your life might tempt you into wanting a cigarette.  · Never have "just one" cigarette. It leads to wanting another and another. Remind yourself of your decision to quit.  · Change habits associated with smoking. If you smoked while driving or when feeling stressed, try other activities to replace smoking. Stand up when drinking your coffee. Brush your teeth after eating. Sit in a different chair when you read the paper. Avoid alcohol while trying to quit, and try to drink fewer caffeinated beverages. Alcohol and caffeine may urge you to smoke.  · Avoid foods and drinks that can trigger a desire to smoke, such as sugary or spicy foods and alcohol.  · Ask people who smoke not to smoke around you.  · Have something planned to do right after eating or having a cup of coffee. For example, plan to take a walk or exercise.  · Try a relaxation exercise to calm you down and decrease your stress. Remember, you may be tense and nervous for the first 2 weeks after you quit, but this will pass.  · Find new activities to keep your hands busy. Play with a pen, coin, or rubber band. Doodle or draw things on paper.  · Brush your teeth right after eating. This will help cut down on the craving for the taste of tobacco after meals. You can also try mouthwash.    · Use oral substitutes in place of cigarettes. Try using lemon drops, carrots, cinnamon sticks, or chewing gum. Keep them handy so they are available when you have the urge to  smoke.  · When you have the urge to smoke, try deep breathing.  · Designate your home as a nonsmoking area.  ·   If you are a heavy smoker, ask your health care provider about a prescription for nicotine chewing gum. It can ease your withdrawal from nicotine.  · Reward yourself. Set aside the cigarette money you save and buy yourself something nice.  · Look for support from others. Join a support group or smoking cessation program. Ask someone at home or at work to help you with your plan to quit smoking.  · Always ask yourself, "Do I need this cigarette or is this just a reflex?" Tell yourself, "Today, I choose not to smoke," or "I do not want to smoke." You are reminding yourself of your decision to quit.  · Do not replace cigarette smoking with electronic cigarettes (commonly called e-cigarettes). The safety of e-cigarettes is unknown, and some may contain harmful chemicals.  · If you relapse, do not give up! Plan ahead and think about what you will do the next time you get the urge to smoke.  HOW WILL I FEEL WHEN I QUIT SMOKING?  You may have symptoms of withdrawal because your body is used to nicotine (the addictive substance in cigarettes). You may crave cigarettes, be irritable, feel very hungry, cough often, get headaches, or have difficulty concentrating. The withdrawal symptoms are only temporary. They are strongest when you first quit but will go away within 10-14 days. When withdrawal symptoms occur, stay in control. Think about your reasons for quitting. Remind yourself that these are signs that your body is healing and getting used to being without cigarettes. Remember that withdrawal symptoms are easier to treat than the major diseases that smoking can cause.   Even after the withdrawal is over, expect periodic urges to smoke. However, these cravings are generally short lived and will go away whether you smoke or not. Do not smoke!  WHAT RESOURCES ARE AVAILABLE TO HELP ME QUIT SMOKING?  Your health care  provider can direct you to community resources or hospitals for support, which may include:  · Group support.  · Education.  · Hypnosis.  · Therapy.  Document Released: 01/06/2004 Document Revised: 08/24/2013 Document Reviewed: 09/25/2012  ExitCare® Patient Information ©2015 [employer]. This information is not intended to replace advice given to you by your health care provider. Make sure you discuss any questions you have with your health care provider.

## 2015-02-19 ENCOUNTER — Emergency Department (HOSPITAL_COMMUNITY)
Admission: EM | Admit: 2015-02-19 | Discharge: 2015-02-19 | Disposition: A | Discharge status: Home / Self Care | Payer: BLUE CROSS/BLUE SHIELD | Attending: Emergency Medicine | Admitting: Emergency Medicine

## 2015-02-19 ENCOUNTER — Encounter (HOSPITAL_COMMUNITY): Payer: Self-pay | Admitting: Family Medicine

## 2015-02-19 ENCOUNTER — Emergency Department (HOSPITAL_COMMUNITY): Payer: BLUE CROSS/BLUE SHIELD

## 2015-02-19 DIAGNOSIS — Z79899 Other long term (current) drug therapy: Secondary | ICD-10-CM | POA: Insufficient documentation

## 2015-02-19 DIAGNOSIS — R0602 Shortness of breath: Secondary | ICD-10-CM | POA: Insufficient documentation

## 2015-02-19 DIAGNOSIS — K219 Gastro-esophageal reflux disease without esophagitis: Secondary | ICD-10-CM | POA: Insufficient documentation

## 2015-02-19 DIAGNOSIS — Z880 Allergy status to penicillin: Secondary | ICD-10-CM | POA: Insufficient documentation

## 2015-02-19 DIAGNOSIS — Z8673 Personal history of transient ischemic attack (TIA), and cerebral infarction without residual deficits: Secondary | ICD-10-CM | POA: Diagnosis not present

## 2015-02-19 DIAGNOSIS — E119 Type 2 diabetes mellitus without complications: Secondary | ICD-10-CM | POA: Diagnosis not present

## 2015-02-19 DIAGNOSIS — F419 Anxiety disorder, unspecified: Secondary | ICD-10-CM | POA: Insufficient documentation

## 2015-02-19 DIAGNOSIS — Z8679 Personal history of other diseases of the circulatory system: Secondary | ICD-10-CM | POA: Diagnosis not present

## 2015-02-19 DIAGNOSIS — Z791 Long term (current) use of non-steroidal anti-inflammatories (NSAID): Secondary | ICD-10-CM | POA: Insufficient documentation

## 2015-02-19 DIAGNOSIS — R079 Chest pain, unspecified: Secondary | ICD-10-CM | POA: Insufficient documentation

## 2015-02-19 DIAGNOSIS — Z720 Tobacco use: Secondary | ICD-10-CM | POA: Diagnosis not present

## 2015-02-19 DIAGNOSIS — Z7982 Long term (current) use of aspirin: Secondary | ICD-10-CM | POA: Diagnosis not present

## 2015-02-19 LAB — BASIC METABOLIC PANEL
Anion gap: 9 (ref 5–15)
BUN: 11 mg/dL (ref 6–20)
CALCIUM: 9.9 mg/dL (ref 8.9–10.3)
CO2: 24 mmol/L (ref 22–32)
CREATININE: 1.09 mg/dL (ref 0.61–1.24)
Chloride: 102 mmol/L (ref 101–111)
GFR calc Af Amer: >60 mL/min (ref >60)
GLUCOSE: 126 mg/dL — AB (ref 65–99)
POTASSIUM: 3.8 mmol/L (ref 3.5–5.1)
SODIUM: 135 mmol/L (ref 135–145)

## 2015-02-19 LAB — CBC
HCT: 47.6 % (ref 39.0–52.0)
Hemoglobin: 16.6 g/dL (ref 13.0–17.0)
MCH: 31.9 pg (ref 26.0–34.0)
MCHC: 34.9 g/dL (ref 30.0–36.0)
MCV: 91.4 fL (ref 78.0–100.0)
PLATELETS: 165 10*3/uL (ref 150–400)
RBC: 5.21 MIL/uL (ref 4.22–5.81)
RDW: 12 % (ref 11.5–15.5)
WBC: 8.5 10*3/uL (ref 4.0–10.5)

## 2015-02-19 LAB — I-STAT TROPONIN, ED
Troponin i, poc: 0 ng/mL (ref 0.00–0.08)
Troponin i, poc: 0 ng/mL (ref 0.00–0.08)

## 2015-02-19 MED ORDER — IOHEXOL 350 MG/ML SOLN
100.0000 mL | Freq: Once | INTRAVENOUS | Status: AC | PRN
  Administered 2015-02-19: 100 mL via INTRAVENOUS

## 2015-02-19 MED ORDER — ASPIRIN 81 MG PO CHEW
324.0000 mg | CHEWABLE_TABLET | Freq: Once | ORAL | Status: AC
  Administered 2015-02-19: 324 mg via ORAL
  Filled 2015-02-19: qty 4

## 2015-02-19 MED ORDER — ACETAMINOPHEN 325 MG PO TABS
325.0000 mg | ORAL_TABLET | Freq: Once | ORAL | Status: AC
  Administered 2015-02-19: 325 mg via ORAL
  Filled 2015-02-19: qty 1

## 2015-02-19 NOTE — ED Notes (Signed)
Pt here for chest tightness, weakness, SOB. sts he woke up at 3 am drenched in sweat. sts anxiety but not sure.

## 2015-02-19 NOTE — ED Notes (Signed)
Pt verbalizes understanding of discharge instructions and follow up information. A/O x4. VSS. NAD

## 2015-02-19 NOTE — Discharge Instructions (Signed)
Nonspecific Chest Pain   You were seen today for your chest pain.  Take a low dose Aspirin daily.  Follow up with cardiology (the heart doctors) as soon as possible.  Return with worsening symptoms or new concerning symptoms.  Chest pain can be caused by many different conditions. There is always a chance that your pain could be related to something serious, such as a heart attack or a blood clot in your lungs. Chest pain can also be caused by conditions that are not life-threatening. If you have chest pain, it is very important to follow up with your health care provider. CAUSES  Chest pain can be caused by:  Heartburn.  Pneumonia or bronchitis.  Anxiety or stress.  Inflammation around your heart (pericarditis) or lung (pleuritis or pleurisy).  A blood clot in your lung.  A collapsed lung (pneumothorax). It can develop suddenly on its own (spontaneous pneumothorax) or from trauma to the chest.  Shingles infection (varicella-zoster virus).  Heart attack.  Damage to the bones, muscles, and cartilage that make up your chest wall. This can include:  Bruised bones due to injury.  Strained muscles or cartilage due to frequent or repeated coughing or overwork.  Fracture to one or more ribs.  Sore cartilage due to inflammation (costochondritis). RISK FACTORS  Risk factors for chest pain may include:  Activities that increase your risk for trauma or injury to your chest.  Respiratory infections or conditions that cause frequent coughing.  Medical conditions or overeating that can cause heartburn.  Heart disease or family history of heart disease.  Conditions or health behaviors that increase your risk of developing a blood clot.  Having had chicken pox (varicella zoster). SIGNS AND SYMPTOMS Chest pain can feel like:  Burning or tingling on the surface of your chest or deep in your chest.  Crushing, pressure, aching, or squeezing pain.  Dull or sharp pain that is worse  when you move, cough, or take a deep breath.  Pain that is also felt in your back, neck, shoulder, or arm, or pain that spreads to any of these areas. Your chest pain may come and go, or it may stay constant. DIAGNOSIS Lab tests or other studies may be needed to find the cause of your pain. Your health care provider may have you take a test called an ambulatory ECG (electrocardiogram). An ECG records your heartbeat patterns at the time the test is performed. You may also have other tests, such as:  Transthoracic echocardiogram (TTE). During echocardiography, sound waves are used to create a picture of all of the heart structures and to look at how blood flows through your heart.  Transesophageal echocardiogram (TEE).This is a more advanced imaging test that obtains images from inside your body. It allows your health care provider to see your heart in finer detail.  Cardiac monitoring. This allows your health care provider to monitor your heart rate and rhythm in real time.  Holter monitor. This is a portable device that records your heartbeat and can help to diagnose abnormal heartbeats. It allows your health care provider to track your heart activity for several days, if needed.  Stress tests. These can be done through exercise or by taking medicine that makes your heart beat more quickly.  Blood tests.  Imaging tests. TREATMENT  Your treatment depends on what is causing your chest pain. Treatment may include:  Medicines. These may include:  Acid blockers for heartburn.  Anti-inflammatory medicine.  Pain medicine for inflammatory conditions.  Antibiotic medicine, if an infection is present.  Medicines to dissolve blood clots.  Medicines to treat coronary artery disease.  Supportive care for conditions that do not require medicines. This may include:  Resting.  Applying heat or cold packs to injured areas.  Limiting activities until pain decreases. HOME CARE  INSTRUCTIONS  If you were prescribed an antibiotic medicine, finish it all even if you start to feel better.  Avoid any activities that bring on chest pain.  Do not use any tobacco products, including cigarettes, chewing tobacco, or electronic cigarettes. If you need help quitting, ask your health care provider.  Do not drink alcohol.  Take medicines only as directed by your health care provider.  Keep all follow-up visits as directed by your health care provider. This is important. This includes any further testing if your chest pain does not go away.  If heartburn is the cause for your chest pain, you may be told to keep your head raised (elevated) while sleeping. This reduces the chance that acid will go from your stomach into your esophagus.  Make lifestyle changes as directed by your health care provider. These may include:  Getting regular exercise. Ask your health care provider to suggest some activities that are safe for you.  Eating a heart-healthy diet. A registered dietitian can help you to learn healthy eating options.  Maintaining a healthy weight.  Managing diabetes, if necessary.  Reducing stress. SEEK MEDICAL CARE IF:  Your chest pain does not go away after treatment.  You have a rash with blisters on your chest.  You have a fever. SEEK IMMEDIATE MEDICAL CARE IF:   Your chest pain is worse.  You have an increasing cough, or you cough up blood.  You have severe abdominal pain.  You have severe weakness.  You faint.  You have chills.  You have sudden, unexplained chest discomfort.  You have sudden, unexplained discomfort in your arms, back, neck, or jaw.  You have shortness of breath at any time.  You suddenly start to sweat, or your skin gets clammy.  You feel nauseous or you vomit.  You suddenly feel light-headed or dizzy.  Your heart begins to beat quickly, or it feels like it is skipping beats. These symptoms may represent a serious  problem that is an emergency. Do not wait to see if the symptoms will go away. Get medical help right away. Call your local emergency services (911 in the [place] Do not drive yourself to the hospital.   This information is not intended to replace advice given to you by your health care provider. Make sure you discuss any questions you have with your health care provider.   Document Released: 01/17/2005 Document Revised: 04/30/2014 Document Reviewed: 11/13/2013 Elsevier Interactive Patient Education [employer]

## 2015-02-19 NOTE — ED Provider Notes (Signed)
CSN: [id number]     Arrival date & time 02/19/15  1125 History   First MD Initiated Contact with Patient 02/19/15 1144     Chief Complaint  Patient presents with  . Anxiety  . Chest Pain  . Shortness of Breath     (Consider location/radiation/quality/duration/timing/severity/associated sxs/prior Treatment) HPI Comments: 59 y.o. Male with history of AAA with repair, stroke, HTN, anxiety presents for chest pain.  The patient reports the pain started last night and he woke up around 3 AM feeling very anxious and with sweat.  He took his morning dose of Ativan and symptoms improved so he went back to sleep.  He has continued to have shortness of breath and intermittent chest pain since that time.  No cough, fever, chills.  He does report recently he has been being worked up for pain in his left shoulder blade area that he feels is in his back.     Past Medical History  Diagnosis Date  . GERD (gastroesophageal reflux disease)   . Diabetes (HCC)     possibly early stage, not formally diagnosed- MD wanted to start on metformin- is not taking per patient   . H/O hiatal hernia     repaired  . AAA (abdominal aortic aneurysm) (HCC)   . Stroke [facility]) 2006    Mild stroke   Past Surgical History  Procedure Laterality Date  . Appendectomy    . Cholecystectomy    . Hernia repair    . Abdominal aortic endovascular stent graft N/A 08/11/2013    Procedure: ABDOMINAL AORTIC ENDOVASCULAR STENT GRAFT- GORE;  Surgeon: Nada Libman, MD;  Location: [facility] OR;  Service: Vascular;  Laterality: N/A;   Family History  Problem Relation Age of Onset  . Cancer Mother     Lung  . Varicose Veins Mother   . Diabetes Mother   . Heart attack Father   . Heart disease Father     Before age 66  . Hyperlipidemia Father   . Heart disease Sister 30    Heart Attack- Before age 52-  PVD   . Hyperlipidemia Sister   . Heart attack Sister   . Cancer Sister     Breast  . Diabetes Sister   . Hyperlipidemia Brother    . Varicose Veins Brother    Social History  Substance Use Topics  . Smoking status: Light Tobacco Smoker -- 0.25 packs/day    Types: Cigarettes  . Smokeless tobacco: Never Used  . Alcohol Use: 1.2 oz/week    2 Shots of liquor per week     Comment: social    Review of Systems  Constitutional: Negative for fever, chills and fatigue.  HENT: Negative for congestion, ear discharge and postnasal drip.   Eyes: Negative for pain and visual disturbance.  Respiratory: Positive for shortness of breath. Negative for cough and chest tightness.   Cardiovascular: Positive for chest pain. Negative for palpitations and leg swelling.  Gastrointestinal: Negative for nausea, vomiting, abdominal pain and diarrhea.  Genitourinary: Negative for dysuria and hematuria.  Musculoskeletal: Negative for myalgias and back pain.  Skin: Negative for rash.  Neurological: Negative for dizziness, weakness and headaches.  Hematological: Does not bruise/bleed easily.      Allergies  Antihistamines, chlorpheniramine-type; Benzonatate; Other; and Penicillins  Home Medications   Prior to Admission medications   Medication Sig Start Date End Date Taking? Authorizing Provider  acetaminophen (TYLENOL) 500 MG tablet Take 1,000 mg by mouth every 8 (eight) hours as needed for  mild pain or moderate pain.   Yes Historical Provider, MD  aspirin 81 MG tablet Take 81 mg by mouth daily.   Yes Historical Provider, MD  cefadroxil (DURICEF) 500 MG capsule Take 2,000 mg by mouth as needed (DENTAL PROCEDURES).  09/22/14  Yes Historical Provider, MD  CRESTOR 5 MG tablet Take 5 mg by mouth daily.  07/26/13  Yes Historical Provider, MD  cyclobenzaprine (FLEXERIL) 10 MG tablet Take 10 mg by mouth 3 (three) times daily as needed for muscle spasms.  08/04/13  Yes Historical Provider, MD  ibuprofen (ADVIL,MOTRIN) 800 MG tablet Take 800 mg by mouth 4 (four) times daily.  08/04/13  Yes Historical Provider, MD  lansoprazole (PREVACID) 30 MG  capsule Take 30 mg by mouth daily.  07/26/13  Yes Historical Provider, MD  LORazepam (ATIVAN) 1 MG tablet Take 1 mg by mouth 2 (two) times daily as needed.  07/26/13  Yes Historical Provider, MD  mometasone (NASONEX) 50 MCG/ACT nasal spray Place 2 sprays into the nose daily as needed (allergies).   Yes Historical Provider, MD  Multiple Vitamins-Minerals (MULTIVITAMIN WITH MINERALS) tablet Take 1 tablet by mouth daily.   Yes Historical Provider, MD  Omega-3 Fatty Acids (FISH OIL) 1000 MG CAPS Take 2 capsules by mouth daily.   Yes Historical Provider, MD  Probiotic Product (ALIGN) 4 MG CAPS Take 4 mg by mouth daily.   Yes Historical Provider, MD   BP 149/93 mmHg  Pulse 90  Temp(Src) 98.3 F (36.8 C) (Oral)  Resp 16  SpO2 95% Physical Exam  Constitutional: He is oriented to person, place, and time. He appears well-developed and well-nourished. No distress.  HENT:  Head: Normocephalic and atraumatic.  Right Ear: External ear normal.  Left Ear: External ear normal.  Mouth/Throat: Oropharynx is clear and moist. No oropharyngeal exudate.  Eyes: EOM are normal. Pupils are equal, round, and reactive to light.  Neck: Normal range of motion. Neck supple.  Cardiovascular: Normal rate, regular rhythm, normal heart sounds and intact distal pulses.   No murmur heard. Pulses:      Radial pulses are 2+ on the right side, and 2+ on the left side.  Pulmonary/Chest: Effort normal. No respiratory distress. He has no wheezes. He has no rales.  Abdominal: Soft. He exhibits no distension. There is no tenderness.  Musculoskeletal: He exhibits no edema.  Neurological: He is alert and oriented to person, place, and time.  Skin: Skin is warm and dry. No rash noted. He is not diaphoretic.  Vitals reviewed.   ED Course  Procedures (including critical care time) Labs Review Labs Reviewed  BASIC METABOLIC PANEL - Abnormal; Notable for the following:    Glucose, Bld 126 (*)    All other components within normal  limits  CBC  I-STAT TROPOININ, ED  Rosezena SensorI-STAT TROPOININ, ED    Imaging Review Dg Chest 2 View  02/19/2015  CLINICAL DATA:  Short of breath this started this morning. EXAM: CHEST  2 VIEW COMPARISON:  08/16/2013 FINDINGS: Normal mediastinum and cardiac silhouette. Normal pulmonary vasculature. No evidence of effusion, infiltrate, or pneumothorax. No acute bony abnormality. IMPRESSION: No acute cardiopulmonary process. Electronically Signed   By: Genevive BiStewart  Edmunds M.D.   On: 02/19/2015 12:34   Ct Angio Chest Aorta W/cm &/or Wo/cm  02/19/2015  CLINICAL DATA:  Left-sided chest pain, back pain, diaphoresis and hypertension. History of EVAR to repair abdominal aortic aneurysm. EXAM: CT ANGIOGRAPHY CHEST, ABDOMEN AND PELVIS TECHNIQUE: Multidetector CT imaging through the chest, abdomen and pelvis  was performed using the standard protocol during bolus administration of intravenous contrast. Multiplanar reconstructed images and MIPs were obtained and reviewed to evaluate the vascular anatomy. CONTRAST:  OMNIPAQUE IOHEXOL 350 MG/ML SOLN COMPARISON:  CTA of the abdomen and pelvis on 09/21/2013 FINDINGS: CTA CHEST FINDINGS The thoracic aorta is normal in caliber and demonstrates no evidence of aneurysmal disease, intramural hemorrhage, dissection or penetrating ulcer disease. Pulmonary arteries are well opacified and there is no evidence of significant pulmonary embolism. There is evidence of coronary atherosclerosis with calcified plaque in the distribution of the LAD and right coronary arteries. The heart size is normal. No pleural or pericardial fluid is seen. Lungs show no evidence of edema, infiltrates or nodules. No airway obstruction identified. No evidence of lymphadenopathy. Review of the MIP images confirms the above findings. CTA ABDOMEN AND PELVIS FINDINGS The aortic endograft shows stable and normal appearance and is widely patent. No evidence of endoleak. The old native aortic aneurysm is shrunken  around the endograft. 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 The superior mesenteric artery bilateral single renal artery normal patency. Native iliac arteries are widely patent bilaterally. The common femoral arteries and femoral bifurcation show normal patency. There is a stable left inguinal hernia containing fat. Stable hepatic steatosis. No abnormalities seen involving bowel. The kidneys enhance normally. The pancreas, adrenal glands and spleen are unremarkable. No abnormal fluid collections. No masses or enlarged lymph nodes are seen. Review of the MIP images confirms the above findings. IMPRESSION: 1. No evidence of aortic dissection. The thoracic aorta is normal in caliber. 2. Coronary atherosclerosis with calcified plaque in the distribution of the LAD and RCA. 3. Stable appearance of aortic endograft status post EVAR. The endograft is normally patent and surrounding shrunken and thrombosed native aneurysm sac appears stable. 4. 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 5. Stable hepatic steatosis. 6. Stable small left inguinal hernia containing fat. Electronically Signed   By: Irish Lack M.D.   On: 02/19/2015 14:15   Ct Angio Abd/pel W/ And/or W/o  02/19/2015  CLINICAL DATA:  Left-sided chest pain, back pain, diaphoresis and hypertension. History of EVAR to repair abdominal aortic aneurysm. EXAM: CT ANGIOGRAPHY CHEST, ABDOMEN AND PELVIS TECHNIQUE: Multidetector CT imaging through the chest, abdomen and pelvis was performed using the standard protocol during bolus administration of intravenous contrast. Multiplanar reconstructed images and MIPs were obtained and reviewed to evaluate the vascular anatomy. CONTRAST:  OMNIPAQUE IOHEXOL 350 MG/ML SOLN COMPARISON:  CTA of the abdomen and pelvis on 09/21/2013 FINDINGS: CTA CHEST FINDINGS The thoracic aorta is normal in caliber and demonstrates no evidence of aneurysmal  disease, intramural hemorrhage, dissection or penetrating ulcer disease. Pulmonary arteries are well opacified and there is no evidence of significant pulmonary embolism. There is evidence of coronary atherosclerosis with calcified plaque in the distribution of the LAD and right coronary arteries. The heart size is normal. No pleural or pericardial fluid is seen. Lungs show no evidence of edema, infiltrates or nodules. No airway obstruction identified. No evidence of lymphadenopathy. Review of the MIP images confirms the above findings. CTA ABDOMEN AND PELVIS FINDINGS The aortic endograft shows stable and normal appearance and is widely patent. No evidence of endoleak. The old native aortic aneurysm is shrunken around the endograft. 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 The superior mesenteric artery bilateral single renal artery normal patency. Native  iliac arteries are widely patent bilaterally. The common femoral arteries and femoral bifurcation show normal patency. There is a stable left inguinal hernia containing fat. Stable hepatic steatosis. No abnormalities seen involving bowel. The kidneys enhance normally. The pancreas, adrenal glands and spleen are unremarkable. No abnormal fluid collections. No masses or enlarged lymph nodes are seen. Review of the MIP images confirms the above findings. IMPRESSION: 1. No evidence of aortic dissection. The thoracic aorta is normal in caliber. 2. Coronary atherosclerosis with calcified plaque in the distribution of the LAD and RCA. 3. Stable appearance of aortic endograft status post EVAR. The endograft is normally patent and surrounding shrunken and thrombosed native aneurysm sac appears stable. 4. 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 5. Stable hepatic steatosis. 6. Stable small left inguinal hernia containing fat. Electronically Signed   By: Irish Lack M.D.   On: 02/19/2015 14:15    I have personally reviewed and evaluated these images and lab results as part of my medical decision-making.   EKG Interpretation   Date/Time:  Saturday February 19 2015 11:47:41 EDT Ventricular Rate:  108 PR Interval:  148 QRS Duration: 86 QT Interval:  338 QTC Calculation: 452 R Axis:   77 Text Interpretation:  Sinus tachycardia Otherwise normal ECG Rate  increased from previous EKG tracing Confirmed by Jams Trickett ([id number]) on  02/19/2015 12:06:18 PM      MDM  Patient seen and evaluated in stable condition.  EKG unremarkable other than mild tachycardia.  Troponin normal.  Other laboratory studies unremarkable.  CT dissection study negative for acute process.  ASA given.  Heartscore 3.  Troponin negative x2.  Discussed results at length with patient and his significant other including patient's risk factors and need for close follow up.  They were in agreement with plan for discharge and follow up outpatient as soon as possible.  Patient to return immediately with new or worsening symptoms.  He was instructed to take ASA daily.  All questions answered prior to discharge. Final diagnoses:  Chest pain    1. Chest pain    Leta Baptist, MD 02/20/15 [phone]

## 2015-02-23 ENCOUNTER — Ambulatory Visit (INDEPENDENT_AMBULATORY_CARE_PROVIDER_SITE_OTHER): Payer: BLUE CROSS/BLUE SHIELD | Admitting: Cardiology

## 2015-02-23 ENCOUNTER — Encounter: Payer: Self-pay | Admitting: Cardiology

## 2015-02-23 VITALS — BP 140/84 | HR 101 | Ht 73.0 in | Wt 198.4 lb

## 2015-02-23 DIAGNOSIS — R079 Chest pain, unspecified: Secondary | ICD-10-CM | POA: Diagnosis not present

## 2015-02-23 DIAGNOSIS — I714 Abdominal aortic aneurysm, without rupture, unspecified: Secondary | ICD-10-CM

## 2015-02-23 DIAGNOSIS — E785 Hyperlipidemia, unspecified: Secondary | ICD-10-CM | POA: Insufficient documentation

## 2015-02-23 DIAGNOSIS — I2510 Atherosclerotic heart disease of native coronary artery without angina pectoris: Secondary | ICD-10-CM

## 2015-02-23 DIAGNOSIS — Z8673 Personal history of transient ischemic attack (TIA), and cerebral infarction without residual deficits: Secondary | ICD-10-CM

## 2015-02-23 DIAGNOSIS — F17200 Nicotine dependence, unspecified, uncomplicated: Secondary | ICD-10-CM | POA: Insufficient documentation

## 2015-02-23 DIAGNOSIS — I2584 Coronary atherosclerosis due to calcified coronary lesion: Secondary | ICD-10-CM

## 2015-02-23 DIAGNOSIS — Z720 Tobacco use: Secondary | ICD-10-CM

## 2015-02-23 MED ORDER — NITROGLYCERIN 0.4 MG SL SUBL: 0.4000 mg | SUBLINGUAL_TABLET | SUBLINGUAL | Status: AC | PRN

## 2015-02-23 NOTE — Patient Instructions (Signed)
Your physician has requested that you have an exercise stress myoview. For further information please visit [email or web address]. Please follow instruction sheet, as given.  Corine ShelterLuke Kilroy, [place]-C, recommends that you schedule a follow-up appointment in 2-3 weeks with Dr Duke Salviaandolph.

## 2015-02-23 NOTE — Assessment & Plan Note (Signed)
1/2 pk a day 

## 2015-02-23 NOTE — Assessment & Plan Note (Signed)
S/P aortic stent graft 08/11/13

## 2015-02-23 NOTE — Progress Notes (Signed)
02/23/2015 Ardeth PerfectBart Cordts   04-14-1956  [id number]  Primary Physician Lester CarolinaMCFADDEN,JOHN C, MD Primary Cardiologist: Dr Duke Salviaandolph to see (new pt)  HPI:  59 y/o male with no history of prior cardiac evaluation, sent to the office today after an ED visit 02/19/15.  He was seen for an episode of chest pain this past Saturday night. The pt describes waking up with mid sternal chest pain associated with diaphoresis. CTA in the ED showed his AO stent graft to be patent. He did have an incidental finding of coronary calcification in the LAD and RCA. Troponin were negative and his EKG was WNL. 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 He has treated dyslipidemia, is a current smoker, and has a strong family history of CAD- F- MI 4250's, died in his 5360's, and his sister has had several PCI's. He is very anxious in the office today. To complicate matters he also is undergoing evaluation for C-spine and T-spine issues causing back and shoulder pain.    Current Outpatient Prescriptions  Medication Sig Dispense Refill  . acetaminophen (TYLENOL) 500 MG tablet Take 1,000 mg by mouth every 8 (eight) hours as needed for mild pain or moderate pain.    Marland Kitchen. aspirin 81 MG tablet Take 81 mg by mouth daily.    . cefadroxil (DURICEF) 500 MG capsule Take 2,000 mg by mouth as needed (DENTAL PROCEDURES).   0  . CRESTOR 5 MG tablet Take 5 mg by mouth daily.     . cyclobenzaprine (FLEXERIL) 10 MG tablet Take 10 mg by mouth 3 (three) times daily as needed for muscle spasms.     Marland Kitchen. ibuprofen (ADVIL,MOTRIN) 800 MG tablet Take 800 mg by mouth 4 (four) times daily.     . lansoprazole (PREVACID) 30 MG capsule Take 30 mg by mouth daily.     Marland Kitchen. LORazepam (ATIVAN) 1 MG tablet Take 1 mg by mouth 2 (two) times daily as needed.     . mometasone (NASONEX) 50 MCG/ACT nasal spray Place 2 sprays into the nose daily as needed (allergies).    . Multiple Vitamins-Minerals  (MULTIVITAMIN WITH MINERALS) tablet Take 1 tablet by mouth daily.    . Omega-3 Fatty Acids (FISH OIL) 1000 MG CAPS Take 2 capsules by mouth daily.    . Probiotic Product (ALIGN) 4 MG CAPS Take 4 mg by mouth daily.    . nitroGLYCERIN (NITROSTAT) 0.4 MG SL tablet Place 1 tablet (0.4 mg total) under the tongue every 5 (five) minutes as needed for chest pain. 25 tablet 3   No current facility-administered medications for this visit.    Allergies  Allergen Reactions  . Antihistamines, Chlorpheniramine-Type Anaphylaxis    All anithistamines   . Benzonatate Other (See Comments)    tounge swelling   . Other Itching    Darvocet  . Penicillins Nausea And Vomiting    Social History   Social History  . Marital Status: Single    Spouse Name: N/A  . Number of Children: N/A  . Years of Education: N/A   Occupational History  . Not on file.   Social History Main Topics  . Smoking status: Light Tobacco Smoker -- 0.25 packs/day    Types: Cigarettes  . Smokeless tobacco: Never Used  . Alcohol Use: 1.2 oz/week    2 Shots of liquor per week     Comment: social  . Drug Use: No  . Sexual Activity: No  Other Topics Concern  . Not on file   Social History Narrative     Review of Systems: General: negative for chills, fever, night sweats or weight changes.  Cardiovascular: negative for chest pain, dyspnea on exertion, edema, orthopnea, palpitations, paroxysmal nocturnal dyspnea or shortness of breath Dermatological: negative for rash Respiratory: negative for cough or wheezing Urologic: negative for hematuria Abdominal: negative for nausea, vomiting, diarrhea, bright red blood per rectum, melena, or hematemesis Neurologic: negative for visual changes, syncope, or dizziness All other systems reviewed and are otherwise negative except as noted above.    Blood pressure 140/84, pulse 101, height  (1.854 m), weight 198 lb 6.4 oz (89.994 kg), SpO2 96 %.  General appearance: alert,  cooperative, no distress and anxious Neck: no carotid bruit and no JVD Lungs: clear to auscultation bilaterally Heart: regular rate and rhythm Abdomen: soft, non-tender; bowel sounds normal; no masses,  no organomegaly Extremities: extremities normal, atraumatic, no cyanosis or edema Pulses: 2+ and symmetric Skin: Skin color, texture, turgor normal. No rashes or lesions Neurologic: Grossly normal  EKG NSR, ST, no acute changes  ASSESSMENT AND PLAN:   Chest pain with moderate risk of acute coronary syndrome Pt referred to the office after an ED visit 02/19/15 with chest pain.  AAA (abdominal aortic aneurysm) (HCC) S/P aortic stent graft 08/11/13  Dyslipidemia On statin Rx  History of TIA (transient ischemic attack) 2006  Smoker < 1/2 pk a day   PLAN  I discussed Mr Nicol situation with Dr Ladona Ridgel in the office today. He suggests we proceed with an exercise Myoview. If there is any ischemia he'll need to have a cath. We added NTG sl prn, I didn't add a beta blocker as he will have an exercise study in 48 hrs. We encouraged him to keep his appointment with his Neurologist- Dr Gerrit Halls in [facility]- tomorrow.   Corine Shelter K PA-C 02/23/2015 3:19 PM

## 2015-02-23 NOTE — Assessment & Plan Note (Signed)
2006

## 2015-02-23 NOTE — Assessment & Plan Note (Signed)
Pt referred to the office after an ED visit 02/19/15 with chest pain.

## 2015-02-23 NOTE — Assessment & Plan Note (Signed)
On statin Rx 

## 2015-02-24 ENCOUNTER — Telehealth (HOSPITAL_COMMUNITY): Payer: Self-pay

## 2015-02-24 NOTE — Telephone Encounter (Signed)
Encounter complete. 

## 2015-02-25 ENCOUNTER — Ambulatory Visit (HOSPITAL_COMMUNITY)
Admission: RE | Admit: 2015-02-25 | Discharge: 2015-02-25 | Disposition: A | Discharge status: Home / Self Care | Payer: BLUE CROSS/BLUE SHIELD | Source: Ambulatory Visit | Attending: Cardiology | Admitting: Cardiology

## 2015-02-25 DIAGNOSIS — R079 Chest pain, unspecified: Secondary | ICD-10-CM

## 2015-02-25 DIAGNOSIS — Z8249 Family history of ischemic heart disease and other diseases of the circulatory system: Secondary | ICD-10-CM | POA: Diagnosis not present

## 2015-02-25 DIAGNOSIS — Z8673 Personal history of transient ischemic attack (TIA), and cerebral infarction without residual deficits: Secondary | ICD-10-CM | POA: Diagnosis not present

## 2015-02-25 DIAGNOSIS — R5383 Other fatigue: Secondary | ICD-10-CM | POA: Diagnosis not present

## 2015-02-25 DIAGNOSIS — Z6826 Body mass index (BMI) 26.0-26.9, adult: Secondary | ICD-10-CM | POA: Diagnosis not present

## 2015-02-25 DIAGNOSIS — F17200 Nicotine dependence, unspecified, uncomplicated: Secondary | ICD-10-CM | POA: Insufficient documentation

## 2015-02-25 DIAGNOSIS — R42 Dizziness and giddiness: Secondary | ICD-10-CM | POA: Insufficient documentation

## 2015-02-25 DIAGNOSIS — R0602 Shortness of breath: Secondary | ICD-10-CM | POA: Diagnosis not present

## 2015-02-25 DIAGNOSIS — R0609 Other forms of dyspnea: Secondary | ICD-10-CM | POA: Diagnosis not present

## 2015-02-25 DIAGNOSIS — E663 Overweight: Secondary | ICD-10-CM | POA: Insufficient documentation

## 2015-02-25 LAB — MYOCARDIAL PERFUSION IMAGING
Estimated workload: 9.4 METS
Exercise duration (min): 7 min
Exercise duration (sec): 36 s
LV dias vol: 75 mL
LV sys vol: 31 mL
MPHR: 164 {beats}/min
Peak HR: 148 {beats}/min
Percent HR: 90 %
RPE: 17
Rest HR: 92 {beats}/min
SDS: 5
SRS: 2
SSS: 7
TID: 1.01

## 2015-02-25 MED ORDER — TECHNETIUM TC 99M SESTAMIBI GENERIC - CARDIOLITE
29.7000 | Freq: Once | INTRAVENOUS | Status: AC | PRN
  Administered 2015-02-25: 30 via INTRAVENOUS

## 2015-02-25 MED ORDER — TECHNETIUM TC 99M SESTAMIBI GENERIC - CARDIOLITE
10.8000 | Freq: Once | INTRAVENOUS | Status: AC | PRN
  Administered 2015-02-25: 11 via INTRAVENOUS

## 2015-03-09 ENCOUNTER — Ambulatory Visit: Payer: BLUE CROSS/BLUE SHIELD

## 2015-03-10 ENCOUNTER — Ambulatory Visit: Payer: BLUE CROSS/BLUE SHIELD | Attending: Family Medicine | Admitting: Physical Therapy

## 2015-03-10 DIAGNOSIS — M542 Cervicalgia: Secondary | ICD-10-CM | POA: Insufficient documentation

## 2015-03-10 DIAGNOSIS — M6281 Muscle weakness (generalized): Secondary | ICD-10-CM | POA: Diagnosis present

## 2015-03-10 DIAGNOSIS — M436 Torticollis: Secondary | ICD-10-CM

## 2015-03-10 NOTE — Progress Notes (Signed)
Cardiology Office Note   Date:  03/11/2015   ID:  Walter Morgan, DOB May 07, 1955, MRN [id number]  PCP:  Walter [place], MD  Cardiologist:   Walter Hook, MD   Chief Complaint  Patient presents with  . New Evaluation    patient reports no complaints.  . Follow-up    stress test      History of Present Illness: Walter Morgan is a 59 y.o. male with AAA, TIA and hyperlipidemia who presents for follow up on chest pain.  Walter Morgan was seen by Walter Shelter, PA-C, on 11/2 as a follow up after being seen in the ED with chest pain on 02/19/15.  He initially presented to the ED on 10/29 with chest pain and shortness of breath.  He had negative cardiac enzymes and his EKG showed sinus tachycardia but was otherwise normal.  When he followed up with Walter Morgan he was referred for an exercise Cardiolite which showed some non-diagnostic ST-T changes but had normal perfusion.  EF was 60%.  Since seeing Walter Morgan he has been doing well.  He has been working with his PCP due to pain in his neck and back.  He had an MRI and the results are pending.  He is scheduled to start physical therapy in a week.  He has noted soreness to touch over his left pectoral region.  He has also noted pain in bilateral shoulders.  He started taking Celebrex 1.5 weeks ago and his symptoms have improved significantly.    In the past he had statin intolerance.  He has been able to tolerate low-dose rosuvastatin.  His PCP follows his lipids regularly and he is due for a repeat assessment next month.  Walter Morgan typically walks or runs with his dog three times per week.  He decreased this recently due to pain in his back.  They went running last week and he had no exertional pain.  However, the following day he had back pain.  Past Medical History  Diagnosis Date  . GERD (gastroesophageal reflux disease)   . Diabetes (HCC)     possibly early stage, not formally diagnosed- MD wanted to start on metformin- is not  taking per patient   . H/O hiatal hernia     repaired  . AAA (abdominal aortic aneurysm) (HCC)   . Stroke [facility]) 2006    Mild stroke    Past Surgical History  Procedure Laterality Date  . Appendectomy    . Cholecystectomy    . Hernia repair    . Abdominal aortic endovascular stent graft N/A 08/11/2013    Procedure: ABDOMINAL AORTIC ENDOVASCULAR STENT GRAFT- GORE;  Surgeon: Walter Libman, MD;  Location: [facility] OR;  Service: Vascular;  Laterality: N/A;     Current Outpatient Prescriptions  Medication Sig Dispense Refill  . acetaminophen (TYLENOL) 500 MG tablet Take 1,000 mg by mouth every 8 (eight) hours as needed for mild pain or moderate pain.    Marland Kitchen aspirin 81 MG tablet Take 81 mg by mouth daily.    . cefadroxil (DURICEF) 500 MG capsule Take 2,000 mg by mouth as needed (DENTAL PROCEDURES).   0  . celecoxib (CELEBREX) 200 MG capsule Take 1 capsule by mouth daily.  0  . CRESTOR 5 MG tablet Take 5 mg by mouth daily.     . cyclobenzaprine (FLEXERIL) 10 MG tablet Take 10 mg by mouth 3 (three) times daily as needed for muscle spasms.     Marland Kitchen HYDROcodone-acetaminophen (NORCO/VICODIN) 5-325 MG  tablet Take 1 tablet by mouth daily as needed.  0  . ibuprofen (ADVIL,MOTRIN) 800 MG tablet Take 800 mg by mouth 4 (four) times daily.     . lansoprazole (PREVACID) 30 MG capsule Take 30 mg by mouth daily.     Marland Kitchen LORazepam (ATIVAN) 1 MG tablet Take 1 mg by mouth 2 (two) times daily as needed.     . mometasone (NASONEX) 50 MCG/ACT nasal spray Place 2 sprays into the nose daily as needed (allergies).    . Multiple Vitamins-Minerals (MULTIVITAMIN WITH MINERALS) tablet Take 1 tablet by mouth daily.    . nitroGLYCERIN (NITROSTAT) 0.4 MG SL tablet Place 1 tablet (0.4 mg total) under the tongue every 5 (five) minutes as needed for chest pain. 25 tablet 3  . Omega-3 Fatty Acids (FISH OIL) 1000 MG CAPS Take 2 capsules by mouth daily.    . Probiotic Product (ALIGN) 4 MG CAPS Take 4 mg by mouth daily.     No current  facility-administered medications for this visit.    Allergies:   Antihistamines, chlorpheniramine-type; Benzonatate; Other; and Penicillins    Social History:  The patient  reports that he has been smoking Cigarettes.  He has been smoking about 0.25 packs per day. He has never used smokeless tobacco. He reports that he drinks about 1.2 oz of alcohol per week. He reports that he does not use illicit drugs.   Family History:  The patient's family history includes Cancer in his mother and sister; Diabetes in his mother and sister; Heart attack in his father and sister; Heart disease in his brother and father; Heart disease (age of onset: 33) in his sister; Hyperlipidemia in his brother, father, and sister; Hypertension in his brother and sister; Varicose Veins in his brother and mother.    ROS:  Please see the history of present illness.   Otherwise, review of systems are positive for none.   All other systems are reviewed and negative.    PHYSICAL EXAM: VS:  BP 126/82 mmHg  Pulse 100  Ht  (1.854 m)  Wt 90.357 kg (199 lb 3.2 oz)  BMI 26.29 kg/m2 , BMI Body mass index is 26.29 kg/(m^2). GENERAL:  Well appearing.  Essential tremor. HEENT:  Pupils equal round and reactive, fundi not visualized, oral mucosa unremarkable NECK:  No jugular venous distention, waveform within normal limits, carotid upstroke brisk and symmetric, no bruits, no thyromegaly LYMPHATICS:  No cervical adenopathy LUNGS:  Clear to auscultation bilaterally HEART:  RRR.  PMI not displaced or sustained,S1 and S2 within normal limits, no S3, no S4, no clicks, no rubs, no murmurs ABD:  Flat, positive bowel sounds normal in frequency in pitch, no bruits, no rebound, no guarding, no midline pulsatile mass, no hepatomegaly, no splenomegaly EXT:  2 plus pulses throughout, no edema, no cyanosis no clubbing SKIN:  No rashes no nodules NEURO:  Cranial nerves II through XII grossly intact, motor grossly intact throughout PSYCH:   Cognitively intact, oriented to person place and time    EKG:  EKG is not ordered today.  Exercise Cardiolite 02/25/15:  The left ventricular ejection fraction is normal (55-65%).  Nuclear stress EF: 60%.  ST segment depression was noted during stress in the II, III and aVF leads.  This is a low risk study.  Low risk stress nuclear study with ST changes but no evidence of ischemia or infarction on perfusion images; EF 60 with normal wall motion.   Recent Labs: 02/19/2015: BUN 11; Creatinine,  Ser 1.09; Hemoglobin 16.6; Platelets 165; Potassium 3.8; Sodium 135    Lipid Panel    Component Value Date/Time   TRIG 244* 08/15/2013 0350      Wt Readings from Last 3 Encounters:  03/11/15 90.357 kg (199 lb 3.2 oz)  02/25/15 89.812 kg (198 lb)  02/23/15 89.994 kg (198 lb 6.4 oz)      ASSESSMENT AND PLAN:  # Non-cardiac chest pain: Mr. Daria PasturesKendzierski's chest pain was due to musculoskeletal strain.  His stress test was negative.  His symptoms have improved with Celebrex and he looks forward to starting PT next next week.  # Non-obstructive CAD: CT angiography during his recent hospitalization revealed coronary calcifications in the LAD and RCA.  He had a negative stress test.  Continue aspirin and rosuvastatin.  Given this finding and his family history of CAD, we will follow up in 1 year.  # Hyperlipidemia: Continue rosuvastatin.  He will have lipids checked with his PCP soon.  Increase physical activity back to at least 30 minutes most days of the week.  # AAA: S/p EVAR.  He is followed by his vascular surgeon annually.  Stable on CT scan 02/19/15.. Current medicines are reviewed at length with the patient today.  The patient does not have concerns regarding medicines.  The following changes have been made:  no change  Labs/ tests ordered today include:  No orders of the defined types were placed in this encounter.     Disposition:   FU with Samai Corea C. Duke Salviaandolph, MD in 1  year.   Signed, Walter Hookandolph, Jayliani Wanner P, MD  03/11/2015 9:04 AM    [facility]

## 2015-03-11 ENCOUNTER — Ambulatory Visit (INDEPENDENT_AMBULATORY_CARE_PROVIDER_SITE_OTHER): Payer: BLUE CROSS/BLUE SHIELD | Admitting: Cardiovascular Disease

## 2015-03-11 ENCOUNTER — Encounter: Payer: Self-pay | Admitting: Cardiovascular Disease

## 2015-03-11 VITALS — BP 126/82 | HR 100 | Ht 73.0 in | Wt 199.2 lb

## 2015-03-11 DIAGNOSIS — E785 Hyperlipidemia, unspecified: Secondary | ICD-10-CM | POA: Diagnosis not present

## 2015-03-11 DIAGNOSIS — R0789 Other chest pain: Secondary | ICD-10-CM

## 2015-03-11 NOTE — Therapy (Signed)
[facility] [address] [place], [place], [address] Phone: [phone]   Fax:  [phone]  Physical Therapy Evaluation  Patient Details  Name: Walter PerfectBart Ruble MRN: [id number] Date of Birth: 09-04-1955 Referring Provider: Dr. Merita NortonStephen Ford  Encounter Date: 03/10/2015      PT End of Session - 03/10/15 0843    Visit Number 1   Number of Visits 16   Date for PT Re-Evaluation 05/05/15   Authorization Type BCBS   PT Start Time 0802   PT Stop Time 0846   PT Time Calculation (min) 44 min   Activity Tolerance Patient tolerated treatment well      Past Medical History  Diagnosis Date  . GERD (gastroesophageal reflux disease)   . Diabetes (HCC)     possibly early stage, not formally diagnosed- MD wanted to start on metformin- is not taking per patient   . H/O hiatal hernia     repaired  . AAA (abdominal aortic aneurysm) (HCC)   . Stroke [facility]) 2006    Mild stroke    Past Surgical History  Procedure Laterality Date  . Appendectomy    . Cholecystectomy    . Hernia repair    . Abdominal aortic endovascular stent graft N/A 08/11/2013    Procedure: ABDOMINAL AORTIC ENDOVASCULAR STENT GRAFT- GORE;  Surgeon: Nada LibmanVance W Brabham, MD;  [facility];  Service: Vascular;  Laterality: N/A;    There were no vitals filed for this visit.  Visit Diagnosis:  Cervicalgia - Plan: PT plan of care cert/re-cert  Neck stiffness - Plan: PT plan of care cert/re-cert  Muscle weakness - Plan: PT plan of care cert/re-cert      Subjective Assessment - 03/10/15 0808    Subjective Started end of Sept while in [place].   Woke up with B neck, scapular, shoulder and anterior shoulder pain x 9 days severe;  Some dizziness/off balance;  Radiates down arms, numbness in fingertips intermittently.   Steroid shot and ex.  No benefit.  Went to neurologist.  Did MRI last Fri no results yet.  Went to ED secondary to chest pain.  CT scan and lab work for  cardiac negative.  Stress test good.  Will see cardiologist tomorrow.     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 depending on chair 1 hour   Diagnostic tests MRI    Patient Stated Goals stop hurting all the time   Currently in Pain? Yes   Pain Score 3    Pain Location Neck   Pain Orientation Right;Left   Pain Type Acute pain   Pain Onset More than a month ago   Pain Frequency Intermittent   Aggravating Factors  sitting;  bent over with head protruded;  arms overhead with neck extension   Pain Relieving Factors deep tissue massage;              [facility] PT Assessment - 03/10/15 0817    Assessment   Medical Diagnosis cervicalgia   Referring Provider Dr. Merita NortonStephen Ford   Onset Date/Surgical Date 01/20/15   Hand Dominance Right   Next MD Visit 03/16/15   Prior Therapy no   Precautions   Precautions None   Restrictions   Weight Bearing Restrictions No   Balance Screen   Has the patient fallen in the past 6 months No   Has the patient had  a decrease in activity level because of a fear of falling?  No   Is the patient reluctant to leave their home because of a fear of falling?  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)   Leisure movies, walks, traveling   Observation/Other Assessments   Focus on Therapeutic Outcomes (FOTO)  52% limited   Posture/Postural Control   Posture/Postural Control Postural limitations   Postural Limitations Rounded Shoulders;Forward head   ROM / Strength   AROM / PROM / Strength AROM   AROM   Overall AROM Comments Shoulder AROM WFLS except endrange stiffness with flexion and IR   AROM Assessment Site Cervical   Cervical Flexion 50   Cervical  Extension 40   Cervical - Right Side Bend 30   Cervical - Left Side Bend 30   Cervical - Right Rotation 25   Cervical - Left Rotation 45   Spurling's   Findings Negative   Distraction Test   Findngs Positive   other    Findings Negative   Comment ULTT                           PT Education - 03/11/15 0658    Education provided Yes   Education Details postural correction; cervical retractions   Person(s) Educated Patient   Methods Explanation;Demonstration;Handout   Comprehension Verbalized understanding;Returned demonstration          PT Short Term Goals - 03/11/15 0927    PT SHORT TERM GOAL #1   Title The patient will demonstrate awareness of sitting postural correction   04/07/15   Time 4   Period Weeks   Status New   PT SHORT TERM GOAL #2   Title The patient will have improved cervical extension to 45 degrees, sidebending to 35 degrees and right rotation to 30 degrees needed for home and work tasks   Time 4   Period Weeks   Status New   PT SHORT TERM GOAL #3   Title The patient will report an overall 25% reduction in pain and function   Time 4   Period Weeks   Status New           PT Long Term Goals - 03/11/15 0931    PT LONG TERM GOAL #1   Title The patient will be independent in safe, self progression of HEP   05/05/15   Time 8   Period Weeks   Status New   PT LONG TERM GOAL #2   Title The patient have improved cervical flexion to 55 degrees, extension to 50 and rotation to 50 degrees needed for home and work mobility and driving with greater ease.   05/05/15   Time 8   Period Weeks   Status New   PT LONG TERM GOAL #3   Title Patient will report a 60% improvement in pain and centralization   Time 8   Period Weeks   Status New   PT LONG TERM GOAL #4   Title Deep cervical flexor strength and periscapular strength to 4+/5 needed for home and work activities and walking his large dog.    05/05/15   Time 8   Period Weeks   Status New    PT LONG TERM GOAL #5   Title  FOTO functional outcome score improved from 52% to 36% indicating improved function with less pain  05/05/15   Time 8   Period Weeks   Status New               Plan - 03/11/15 0654    Clinical Impression Statement Started end of Sept while in [place].   Woke up with B neck, scapular, shoulder and anterior shoulder pain x 9 days severe;  Some dizziness/off balance;  Radiates down arms, numbness in fingertips intermittently.   Steroid shot and ex.  No benefit.  Went to neurologist.  Did MRI last Fri no results yet.  Went to ED secondary to chest pain.  CT scan and lab work for cardiac negative and stress test good but will see cardiologist tomorrow.  He presents with poor sitting posture  with forward head and rounded shoulders.  Decreased cervical lordosis.  Decreased cervical AROM in all planes with no clear directional preference.  Negative upper limb tension test, negative Spurlings, decreased pain with cervical distraction.  Normal UE AROM except endrange overhead shoulder stiffness and IR.  UE strength WNLS.  Normal sensation.  Decreased deep flexor muscle activation and periscapular strength.   He would benefit from PT to address defiicits.     Pt will benefit from skilled therapeutic intervention in order to improve on the following deficits Pain;Decreased range of motion;Postural dysfunction;Impaired flexibility;Decreased strength   Rehab Potential Good   PT Frequency 2x / week   PT Duration 8 weeks   PT Treatment/Interventions Electrical Stimulation;Cryotherapy;Moist Heat;Ultrasound;Neuromuscular re-education;Patient/family education;Manual techniques;Dry needling;Taping   PT Next Visit Plan manual techniques for joint mobility c-spine and soft tissue muscle length;  cervical distraction;  dry needling;  continue to assess for directional preference; modalities as needed;   PT Home Exercise Plan cervical retractions sitting and supine          Problem List Patient Active Problem List   Diagnosis Date Noted  . Chest pain with moderate risk of acute coronary syndrome 02/23/2015  . CAD (coronary artery disease) 02/23/2015  . Dyslipidemia 02/23/2015  . History of TIA (transient ischemic attack) 02/23/2015  . Smoker 02/23/2015  . Acute respiratory failure with hypoxia (HCC) 08/13/2013  . Encephalopathy acute 08/12/2013  . AAA (abdominal aortic aneurysm) (HCC) 08/11/2013  . Dissection of aorta, abdominal (HCC) 08/09/2013    Walter Morgan 03/11/2015, 11:25 AM  [facility] [address] [place], [place], [address] Phone: [phone]   Fax:  [phone]  Name: Walter Morgan MRN: [id number] Date of Birth: January 22, 1956   Lavinia Sharps, PT 03/11/2015 11:25 AM Phone: [phone] Fax: [phone]

## 2015-03-11 NOTE — Patient Instructions (Signed)
Dr [place] recommends that you schedule a follow-up appointment in 1 year. You will receive a reminder letter in the mail two months in advance. If you don't receive a letter, please call our office to schedule the follow-up appointment.  If you need a refill on your cardiac medications before your next appointment, please call your pharmacy. 

## 2015-03-15 ENCOUNTER — Ambulatory Visit: Payer: BLUE CROSS/BLUE SHIELD | Admitting: Physical Therapy

## 2015-03-15 DIAGNOSIS — M542 Cervicalgia: Secondary | ICD-10-CM

## 2015-03-15 DIAGNOSIS — M436 Torticollis: Secondary | ICD-10-CM

## 2015-03-15 DIAGNOSIS — M6281 Muscle weakness (generalized): Secondary | ICD-10-CM

## 2015-03-15 NOTE — Therapy (Signed)
[facility] [address] [place], [place], [address] Phone: [phone]   Fax:  [phone]  Physical Therapy Treatment  Patient Details  Name: Walter Morgan MRN: [id number] Date of Birth: June 11, 1955 Referring Provider: Dr. Merita NortonStephen Ford  Encounter Date: 03/15/2015      PT End of Session - 03/15/15 0941    Visit Number 2   Number of Visits 16   Date for PT Re-Evaluation 05/05/15   PT Start Time 0930   PT Stop Time 1030   PT Time Calculation (min) 60 min      Past Medical History  Diagnosis Date  . GERD (gastroesophageal reflux disease)   . Diabetes (HCC)     possibly early stage, not formally diagnosed- MD wanted to start on metformin- is not taking per patient   . H/O hiatal hernia     repaired  . AAA (abdominal aortic aneurysm) (HCC)   . Stroke [facility](HCC) 2006    Mild stroke    Past Surgical History  Procedure Laterality Date  . Appendectomy    . Cholecystectomy    . Hernia repair    . Abdominal aortic endovascular stent graft N/A 08/11/2013    Procedure: ABDOMINAL AORTIC ENDOVASCULAR STENT GRAFT- GORE;  Surgeon: Nada LibmanVance W Brabham, MD;  [facility];  Service: Vascular;  Laterality: N/A;    There were no vitals filed for this visit.  Visit Diagnosis:  Cervicalgia  Neck stiffness  Muscle weakness      Subjective Assessment - 03/15/15 0937    Subjective The celebrex that I'm on has helped a lot. Waiting on MRI to see root cause of neck pain. "I get muscle spasms in my upper traps."   Pain Score 3                          [facility] Adult PT Treatment/Exercise - 03/15/15 1316    Neck Exercises: Supine   Cervical Isometrics Extension;5 secs;10 reps   Other Supine Exercise Alt UE and circles clock wise and counter clockwise x20 each  with chin tucks   Other Supine Exercise Horizontal Abduction with yellow TB x 20 reps,  PNF D2 extension with yellow TB x 20 reps,  IR/ER with yellow TB x 20  reps;  narrow hold bilateral flexion with yellow TB x 20 reps;  pec stretch passive with towel roll under shoulder blades 3 reps x 30 seconds   Shoulder Exercises: Standing   Extension Strengthening;10 reps   Theraband Level (Shoulder Extension) Level 1 (Yellow)   Row Strengthening;10 reps   Theraband Level (Shoulder Row) Level 1 (Yellow)   Manual Therapy   Manual Therapy Soft tissue mobilization   Manual therapy comments subocciptal release; PROM laterally and into rotation; scalene stretching and pec stretching 3 reps x 30 seconds each   Ankle Exercises: Aerobic   Stationary Bike Nustep UE and LE L5 7 minutes         Moist Heat                                                                          Cervical; 15 minutes; supine  PT Short Term Goals - 03/11/15 0927    PT SHORT TERM GOAL #1   Title The patient will demonstrate awareness of sitting postural correction   04/07/15   Time 4   Period Weeks   Status New   PT SHORT TERM GOAL #2   Title The patient will have improved cervical extension to 45 degrees, sidebending to 35 degrees and right rotation to 30 degrees needed for home and work tasks   Time 4   Period Weeks   Status New   PT SHORT TERM GOAL #3   Title The patient will report an overall 25% reduction in pain and function   Time 4   Period Weeks   Status New           PT Long Term Goals - 03/11/15 0931    PT LONG TERM GOAL #1   Title The patient will be independent in safe, self progression of HEP   05/05/15   Time 8   Period Weeks   Status New   PT LONG TERM GOAL #2   Title The patient have improved cervical flexion to 55 degrees, extension to 50 and rotation to 50 degrees needed for home and work mobility and driving with greater ease.   05/05/15   Time 8   Period Weeks   Status New   PT LONG TERM GOAL #3   Title Patient will report a 60% improvement in pain and centralization   Time 8   Period Weeks   Status New   PT LONG TERM  GOAL #4   Title Deep cervical flexor strength and periscapular strength to 4+/5 needed for home and work activities and walking his large dog.    05/05/15   Time 8   Period Weeks   Status New   PT LONG TERM GOAL #5   Title FOTO functional outcome score improved from 52% to 36% indicating improved function with less pain  05/05/15   Time 8   Period Weeks   Status New               Plan - 03/15/15 0941    Clinical Impression Statement Pt. presents with less pain than last session per patient report. He is waiting on the results of the MRI to see what the root cause of the cervical pain is. initiated a scap stabilization series and added some manual traction. None of the exercises caused any increase in pain   PT Next Visit Plan manual techniques for joint mobility c-spine and soft tissue muscle length;  cervical distraction;  dry needling;  continue to assess for directional preference; modalities as needed;        Problem List Patient Active Problem List   Diagnosis Date Noted  . Chest pain with moderate risk of acute coronary syndrome 02/23/2015  . CAD (coronary artery disease) 02/23/2015  . Dyslipidemia 02/23/2015  . History of TIA (transient ischemic attack) 02/23/2015  . Smoker 02/23/2015  . Acute respiratory failure with hypoxia (HCC) 08/13/2013  . Encephalopathy acute 08/12/2013  . AAA (abdominal aortic aneurysm) (HCC) 08/11/2013  . Dissection of aorta, abdominal (HCC) 08/09/2013   [name], SPTA 03/15/2015 1:23 PM PHONE:[phone] FAX:[phone]  [facility] [address] [place], [place], [address] Phone: [phone]   Fax:  [phone]  Name: Walter Morgan MRN: [id number] Date of Birth: 1955/06/18

## 2015-03-21 ENCOUNTER — Ambulatory Visit: Payer: BLUE CROSS/BLUE SHIELD | Admitting: Physical Therapy

## 2015-03-21 DIAGNOSIS — M542 Cervicalgia: Secondary | ICD-10-CM | POA: Diagnosis not present

## 2015-03-21 DIAGNOSIS — M6281 Muscle weakness (generalized): Secondary | ICD-10-CM

## 2015-03-21 DIAGNOSIS — M436 Torticollis: Secondary | ICD-10-CM

## 2015-03-21 NOTE — Therapy (Addendum)
[facility] [address] [place], [place], [address] Phone: [phone]   Fax:  [phone]  Physical Therapy Treatment  Patient Details  Name: Walter Morgan MRN: [id number] Date of Birth: 07-09-1955 Referring Provider: Dr. Merita Norton  Encounter Date: 03/21/2015      PT End of Session - 03/21/15 0922    Visit Number 3   Number of Visits 16   Date for PT Re-Evaluation 05/05/15   Authorization Type [employer]   PT Start Time 0845   PT Stop Time 0938   PT Time Calculation (min) 53 min   Activity Tolerance Patient tolerated treatment well   Behavior During Therapy [facility] for tasks assessed/performed      Past Medical History  Diagnosis Date  . GERD (gastroesophageal reflux disease)   . Diabetes (HCC)     possibly early stage, not formally diagnosed- MD wanted to start on metformin- is not taking per patient   . H/O hiatal hernia     repaired  . AAA (abdominal aortic aneurysm) (HCC)   . Stroke [facility]) 2006    Mild stroke    Past Surgical History  Procedure Laterality Date  . Appendectomy    . Cholecystectomy    . Hernia repair    . Abdominal aortic endovascular stent graft N/A 08/11/2013    Procedure: ABDOMINAL AORTIC ENDOVASCULAR STENT GRAFT- GORE;  Surgeon: Nada Libman, MD;  Location: [facility] OR;  Service: Vascular;  Laterality: N/A;    There were no vitals filed for this visit.  Visit Diagnosis:  Cervicalgia  Neck stiffness  Muscle weakness      Subjective Assessment - 03/21/15 0835    Subjective The neck seems to be getting better. Pt reports getting the imaging and tha the has a herniated disc between C3-C4.   Currently in Pain? Yes   Pain Score 2    Pain Location Neck   Pain Orientation Right;Left   Pain Descriptors / Indicators Aching   Pain Type Chronic pain   Pain Onset More than a month ago   Pain Frequency Intermittent   Aggravating Factors  sitting; bent over with head protruded; arms overhead  with neck extensions.   Pain Relieving Factors deep tissue massage                         [facility] Adult PT Treatment/Exercise - 03/21/15 0909    Neck Exercises: Machines for Strengthening   Other Machines for Strengthening Nu-step L 5 x 8 min   Modalities   Modalities Moist Heat;Electrical Stimulation   Moist Heat Therapy   Number Minutes Moist Heat 15 Minutes   Moist Heat Location Shoulder  L upper trap, in supine   Electrical Stimulation   Electrical Stimulation Location L upper trap/ levator   Electrical Stimulation Action IFC   Electrical Stimulation Parameters 100% scan, L 13 x 15 min   Electrical Stimulation Goals Pain   Manual Therapy   Manual Therapy Joint mobilization;Soft tissue mobilization   Manual therapy comments subocciptal release; PROM laterally and into rotation; scalene stretching and pec stretching 3 reps x 30 seconds each   Joint Mobilization Thoracic T1-T7 Grade 2-3 mobs   Soft tissue mobilization instrument assisted STM over the L upper trap and levator scape. Myofascial release of L upper trap levator scapulae.   Neck Exercises: Stretches   Upper Trapezius Stretch 2 reps;30 seconds   Levator Stretch 2 reps;30 seconds  Trigger Point Dry Needling - 03/21/15 0849    Consent Given? Yes   Education Handout Provided Yes   Muscles Treated Upper Body Upper trapezius;Levator scapulae   Upper Trapezius Response Twitch reponse elicited;Palpable increased muscle length   Levator Scapulae Response Twitch response elicited;Palpable increased muscle length              PT Education - 03/21/15 0922    Education Details dry needling education, and E-stim/ heat for pain    Person(s) Educated Patient   Methods Explanation   Comprehension Verbalized understanding          PT Short Term Goals - 03/21/15 0927    PT SHORT TERM GOAL #1   Title The patient will demonstrate awareness of sitting postural correction   04/07/15   Time 4    Period Weeks   Status On-going   PT SHORT TERM GOAL #2   Title The patient will have improved cervical extension to 45 degrees, sidebending to 35 degrees and right rotation to 30 degrees needed for home and work tasks   Time 4   Period Weeks   Status On-going   PT SHORT TERM GOAL #3   Title The patient will report an overall 25% reduction in pain and function   Time 4   Period Weeks   Status On-going           PT Long Term Goals - 03/21/15 0927    PT LONG TERM GOAL #1   Title The patient will be independent in safe, self progression of HEP   05/05/15   Time 8   Period Weeks   Status On-going   PT LONG TERM GOAL #2   Title The patient have improved cervical flexion to 55 degrees, extension to 50 and rotation to 50 degrees needed for home and work mobility and driving with greater ease.   05/05/15   Time 8   Period Weeks   Status On-going   PT LONG TERM GOAL #3   Title Patient will report a 60% improvement in pain and centralization   Time 8   Period Weeks   Status On-going   PT LONG TERM GOAL #4   Title Deep cervical flexor strength and periscapular strength to 4+/5 needed for home and work activities and walking his large dog.    05/05/15   Time 8   Period Weeks   Status On-going   PT LONG TERM GOAL #5   Title FOTO functional outcome score improved from 52% to 36% indicating improved function with less pain  05/05/15   Time 8   Period Weeks   Status On-going               Plan - 03/21/15 0923    Clinical Impression Statement Walter Morgan repots that he his making progress with the celebrex and exercises. He states he did get imagining on the neck and per his report he has a central disc herniation of C3-C4 and plans to see a Careers advisersurgeon. he provided consent for dry needling and reported multiple twitches in the L upper trap and Levator scapulae with relief of tension with cervical  rotation. He was able to do the remaining exercise and requested heat and e-stim for residual  sorness following todays session.    PT Next Visit Plan manual techniques for joint mobility c-spine and soft tissue muscle length;  cervical distraction;  dry needling;  continue to assess for directional preference; modalities as needed;   PT  Home Exercise Plan cervical retractions sitting and supine   Consulted and Agree with Plan of Care Patient        Problem List Patient Active Problem List   Diagnosis Date Noted  . Chest pain with moderate risk of acute coronary syndrome 02/23/2015  . CAD (coronary artery disease) 02/23/2015  . Dyslipidemia 02/23/2015  . History of TIA (transient ischemic attack) 02/23/2015  . Smoker 02/23/2015  . Acute respiratory failure with hypoxia (HCC) 08/13/2013  . Encephalopathy acute 08/12/2013  . AAA (abdominal aortic aneurysm) (HCC) 08/11/2013  . Dissection of aorta, abdominal (HCC) 08/09/2013    Lulu Riding PT, DPT, LAT, ATC  03/21/2015  9:41 AM      [facility] [address] [place], [place], [address] Phone: [phone]   Fax:  [phone]  Name: Walter Morgan MRN: [id number] Date of Birth: 03-Jun-1955

## 2015-03-23 ENCOUNTER — Ambulatory Visit: Payer: BLUE CROSS/BLUE SHIELD | Admitting: Physical Therapy

## 2015-03-23 DIAGNOSIS — M436 Torticollis: Secondary | ICD-10-CM

## 2015-03-23 DIAGNOSIS — M542 Cervicalgia: Secondary | ICD-10-CM | POA: Diagnosis not present

## 2015-03-23 DIAGNOSIS — M6281 Muscle weakness (generalized): Secondary | ICD-10-CM

## 2015-03-23 NOTE — Therapy (Signed)
[facility] [address] [place], [place], [address] Phone: [phone]   Fax:  [phone]  Physical Therapy Treatment  Patient Details  Name: Walter Morgan MRN: [id number] Date of Birth: [birth year] Referring Provider: Dr. Merita NortonStephen Ford  Encounter Date: 03/23/2015      PT End of Session - 03/23/15 1411    Visit Number 4   Number of Visits 16   Date for PT Re-Evaluation 05/05/15   PT Start Time 1415   PT Stop Time 1515   PT Time Calculation (min) 60 min      Past Medical History  Diagnosis Date  . GERD (gastroesophageal reflux disease)   . Diabetes (HCC)     possibly early stage, not formally diagnosed- MD wanted to start on metformin- is not taking per patient   . H/O hiatal hernia     repaired  . AAA (abdominal aortic aneurysm) (HCC)   . Stroke [facility](HCC) 2006    Mild stroke    Past Surgical History  Procedure Laterality Date  . Appendectomy    . Cholecystectomy    . Hernia repair    . Abdominal aortic endovascular stent graft N/A 08/11/2013    Procedure: ABDOMINAL AORTIC ENDOVASCULAR STENT GRAFT- GORE;  Surgeon: Nada LibmanVance W Brabham, MD;  [facility];  Service: Vascular;  Laterality: N/A;    There were no vitals filed for this visit.  Visit Diagnosis:  Cervicalgia  Neck stiffness  Muscle weakness      Subjective Assessment - 03/23/15 1414    Subjective The needling made me hurt so bad the night before, but then I felt a little better. With the way my MRI looks I think aligning my spine is a bad idea. supposed to call today with an appointment from the surgeons office.   Currently in Pain? Yes   Pain Score 3    Pain Location Neck   Pain Type Chronic pain   Pain Relieving Factors deep tissue massage                         [facility] Adult PT Treatment/Exercise - 03/23/15 1437    Neck Exercises: Machines for Strengthening   UBE (Upper Arm Bike) L1 3 minutes forward 3 minutes backwards   Neck Exercises: Supine   Cervical Isometrics Extension;5 secs;10 reps       Other Supine Exercise Horizontal Abduction with yellow TB x 20 reps, PNF D2 extension with yellow TB x 20 reps, IR/ER with yellow TB x 20 reps; narrow hold bilateral flexion with yellow TB x 20 reps; pec stretch passive with towel roll under shoulder blades 3 reps x 30 seconds   Moist Heat Therapy   Moist Heat Location Shoulder   upper trap, in prone   Electrical Stimulation   Electrical Stimulation Location L upper trap/ levator   Electrical Stimulation Goals Parameters Time Pain IFC to tolerance  15 minutes   Manual Therapy   Soft tissue mobilization instrument assisted STM over the L upper trap and levator scape. Myofascial release of L upper trap levator scapulae.                  PT Short Term Goals - 03/21/15 0927    PT SHORT TERM GOAL #1   Title The patient will demonstrate awareness of sitting postural correction   04/07/15   Time 4   Period Weeks   Status On-going   PT SHORT TERM GOAL #2  Title The patient will have improved cervical extension to 45 degrees, sidebending to 35 degrees and right rotation to 30 degrees needed for home and work tasks   Time 4   Period Weeks   Status On-going   PT SHORT TERM GOAL #3   Title The patient will report an overall 25% reduction in pain and function   Time 4   Period Weeks   Status On-going           PT Long Term Goals - 03/21/15 1610    PT LONG TERM GOAL #1   Title The patient will be independent in safe, self progression of HEP   05/05/15   Time 8   Period Weeks   Status On-going   PT LONG TERM GOAL #2   Title The patient have improved cervical flexion to 55 degrees, extension to 50 and rotation to 50 degrees needed for home and work mobility and driving with greater ease.   05/05/15   Time 8   Period Weeks   Status On-going   PT LONG TERM GOAL #3   Title Patient will report a 60% improvement in pain and centralization   Time 8    Period Weeks   Status On-going   PT LONG TERM GOAL #4   Title Deep cervical flexor strength and periscapular strength to 4+/5 needed for home and work activities and walking his large dog.    05/05/15   Time 8   Period Weeks   Status On-going   PT LONG TERM GOAL #5   Title FOTO functional outcome score improved from 52% to 36% indicating improved function with less pain  05/05/15   Time 8   Period Weeks   Status On-going               Plan - 03/23/15 1412    Clinical Impression Statement Walter Morgan cont. to report that he is making progress with the exercises, but he experienced a lot of pain the day after needling and with the spinal mobs. He felt that the needling was effective after the 24 hour period. Tolerated treatment well  no sig increase in pain.   PT Next Visit Plan manual techniques for joint mobility c-spine and soft tissue muscle length;  cervical distraction;  dry needling;  continue to assess for directional preference; modalities as needed;        Problem List Patient Active Problem List   Diagnosis Date Noted  . Chest pain with moderate risk of acute coronary syndrome 02/23/2015  . CAD (coronary artery disease) 02/23/2015  . Dyslipidemia 02/23/2015  . History of TIA (transient ischemic attack) 02/23/2015  . Smoker 02/23/2015  . Acute respiratory failure with hypoxia (HCC) 08/13/2013  . Encephalopathy acute 08/12/2013  . AAA (abdominal aortic aneurysm) (HCC) 08/11/2013  . Dissection of aorta, abdominal (HCC) 08/09/2013   [name], SPTA 03/23/2015 3:43 PM [phone] [phone]  [facility] [address] [place], [place], [address] Phone: [phone]   Fax:  [phone]  Name: Walter Morgan MRN: [id number] Date of Birth: Feb 16, 1956

## 2015-03-29 ENCOUNTER — Ambulatory Visit: Payer: BLUE CROSS/BLUE SHIELD | Attending: Family Medicine | Admitting: Physical Therapy

## 2015-03-29 DIAGNOSIS — M6281 Muscle weakness (generalized): Secondary | ICD-10-CM | POA: Insufficient documentation

## 2015-03-29 DIAGNOSIS — M542 Cervicalgia: Secondary | ICD-10-CM

## 2015-03-29 DIAGNOSIS — M436 Torticollis: Secondary | ICD-10-CM

## 2015-03-29 NOTE — Therapy (Addendum)
[place], [place], [address] Phone: [phone]   Fax:  [phone]  Physical Therapy Treatment/Discharge Summary  Patient Details  Name: Walter Morgan MRN: [id number] Date of Birth: 1955/10/28 Referring Provider: Dr. Chauncey Reading  Encounter Date: 03/29/2015      PT End of Session - 03/29/15 1226    Visit Number 5   Number of Visits 16   Date for PT Re-Evaluation 05/05/15   Authorization Type BCBS   PT Start Time 3546   PT Stop Time 1223   PT Time Calculation (min) 38 min   Activity Tolerance Patient limited by pain      Past Medical History  Diagnosis Date  . GERD (gastroesophageal reflux disease)   . Diabetes ([place])     possibly early stage, not formally diagnosed- MD wanted to start on metformin- is not taking per patient   . H/O hiatal hernia     repaired  . AAA (abdominal aortic aneurysm) ([place])   . Stroke [facility]) 2006    Mild stroke    Past Surgical History  Procedure Laterality Date  . Appendectomy    . Cholecystectomy    . Hernia repair    . Abdominal aortic endovascular stent graft N/A 08/11/2013    Procedure: ABDOMINAL AORTIC ENDOVASCULAR STENT GRAFT- GORE;  Surgeon: Serafina Mitchell, MD;  Location: [facility];  Service: Vascular;  Laterality: N/A;    There were no vitals filed for this visit.  Visit Diagnosis:  Cervicalgia  Neck stiffness  Muscle weakness      Subjective Assessment - 03/29/15 1145    Subjective C3-4 HNP sees neurosurgeon  12/13.  Patient states "It is pressing on the spinal cord."  Bilateral N/T in fingers 2-3x/week.  "I've been dropping a few things lately over the last few weeks."  Reports extreme soreness following the dry needling and not worth it outcome-wise.  Increased pain with joint mobs.  Patient states the Celebrex helps some but no overall benefit from PT.     Currently in Pain? Yes   Pain Score 4    Pain Location Neck   Pain Orientation Right;Left    Pain Type Chronic pain   Pain Relieving Factors IFC, heat            [facility] PT Assessment - 03/29/15 1200    AROM   Cervical Flexion 50   Cervical Extension 25   Cervical - Right Side Bend 30   Cervical - Left Side Bend 40   Cervical - Right Rotation 45   Cervical - Left Rotation 50   Strength   Strength Assessment Site Shoulder;Elbow;Forearm;Wrist;Hand   Right/Left Shoulder Right;Left   Right Shoulder Flexion 4+/5   Right Shoulder Extension 4+/5   Right Shoulder ABduction 4+/5   Right Shoulder Internal Rotation 4+/5   Right Shoulder External Rotation 4+/5   Left Shoulder Flexion 4+/5   Left Shoulder Extension 4+/5   Left Shoulder ABduction 4+/5   Left Shoulder Internal Rotation 4+/5   Left Shoulder External Rotation 4+/5   Right/Left Elbow Right;Left   Right Elbow Flexion 5/5   Right Elbow Extension 5/5   Left Elbow Flexion 5/5   Left Elbow Extension 5/5   Right/Left Forearm Right;Left   Right/Left Wrist Right;Left   Right/Left hand Right;Left   Right Hand Grip (lbs) 60#   Left Hand Grip (lbs) 65#  [facility] Adult PT Treatment/Exercise - 03/29/15 0001    Neck Exercises: Seated   Other Seated Exercise Review of HEP for UE strengthening upper quadruant                PT Education - 03/29/15 1224    Education provided Yes   Education Details Self care including UE strengthening   Person(s) Educated Patient   Methods Explanation   Comprehension Verbalized understanding          PT Short Term Goals - 03/29/15 1212    PT SHORT TERM GOAL #1   Title The patient will demonstrate awareness of sitting postural correction   04/07/15   Time 4   Period Weeks   Status On-going   PT SHORT TERM GOAL #2   Title The patient will have improved cervical extension to 45 degrees, sidebending to 35 degrees and right rotation to 30 degrees needed for home and work tasks   Time 4   Period Weeks   Status On-going   PT SHORT TERM GOAL #3    Title The patient will report an overall 25% reduction in pain and function   Time 4   Period Weeks   Status On-going           PT Long Term Goals - 03/29/15 1210    PT LONG TERM GOAL #1   Title The patient will be independent in safe, self progression of HEP   05/05/15   Time 8   Period Weeks   Status On-going   PT LONG TERM GOAL #2   Title The patient have improved cervical flexion to 55 degrees, extension to 50 and rotation to 50 degrees needed for home and work mobility and driving with greater ease.   05/05/15   Time 8   Period Weeks   Status On-going   PT LONG TERM GOAL #3   Title Patient will report a 60% improvement in pain and centralization   Time 8   Period Weeks   Status On-going   PT LONG TERM GOAL #4   Title Deep cervical flexor strength and periscapular strength to 4+/5 needed for home and work activities and walking his large dog.    05/05/15   Time 8   Period Weeks   Status On-going   PT LONG TERM GOAL #5   Title FOTO functional outcome score improved from 52% to 36% indicating improved function with less pain  05/05/15   Time 8   Period Weeks   Status On-going               Plan - 03/29/15 1227    Clinical Impression Statement The patient reports no overall improvement with PT after 5 visits which included:  manual therapy, dry needling, postural education, strengthening and modalities for pain control.  We discussed a trial of cervical traction but the patient wishes to see the neurosurgeon on 12/13 and we agreed to hold PT until that time.  Cervical AROM continues to be limited and especially painful with extension.  Mild right grip strength loss.  Intermittent distal paresthesia 2-3x/week bilaterally.     PT Next Visit Plan Hold PT until MD appt     PHYSICAL THERAPY DISCHARGE SUMMARY  Visits from Start of Care: 5  Current functional level related to goals / functional outcomes: The patient reports no improvement (see clinical impression  statement above).  He followed up with the doctor and corresponded via email with therapist that the doctor wants  him to discontinue PT right now for possible other medical interventions.     Remaining deficits: See above  Education / Equipment: Basic self care, HEP  Plan: Patient agrees to discharge.  Patient goals were not met. Patient is being discharged due to the physician's request.  ?????       Problem List Patient Active Problem List   Diagnosis Date Noted  . Chest pain with moderate risk of acute coronary syndrome 02/23/2015  . CAD (coronary artery disease) 02/23/2015  . Dyslipidemia 02/23/2015  . History of TIA (transient ischemic attack) 02/23/2015  . Smoker 02/23/2015  . Acute respiratory failure with hypoxia ([place]) 08/13/2013  . Encephalopathy acute 08/12/2013  . AAA (abdominal aortic aneurysm) ([place]) 08/11/2013  . Dissection of aorta, abdominal ([place]) 08/09/2013    Alvera Singh 03/29/2015, 12:35 PM  [place], [place], [address] Phone: [phone]   Fax:  [phone]  Name: Walter Morgan MRN: [id number] Date of Birth: [birth year]    Ruben Im, PT 03/29/2015 12:36 PM Phone: [phone] Fax: [phone]

## 2015-03-29 NOTE — Patient Instructions (Signed)
[email or web address]   Keep upper extremity strengthening with band or light bands as long as peripheral symptoms don't worsen.    Call with name of neurosurgeon if you want notes to be forwarded.   Follow up with me by phone  [phone] or e-mail after MD appt regarding close chart here or continue.

## 2015-03-31 ENCOUNTER — Encounter: Payer: BLUE CROSS/BLUE SHIELD | Admitting: Physical Therapy

## 2015-04-05 ENCOUNTER — Encounter: Payer: BLUE CROSS/BLUE SHIELD | Admitting: Physical Therapy

## 2015-04-07 ENCOUNTER — Ambulatory Visit: Payer: BLUE CROSS/BLUE SHIELD | Admitting: Physical Therapy

## 2015-08-01 ENCOUNTER — Encounter: Payer: Self-pay | Admitting: Cardiology

## 2015-10-03 ENCOUNTER — Encounter: Payer: Self-pay | Admitting: Family

## 2015-10-10 ENCOUNTER — Ambulatory Visit (HOSPITAL_COMMUNITY)
Admission: RE | Admit: 2015-10-10 | Discharge: 2015-10-10 | Disposition: A | Discharge status: Home / Self Care | Payer: BLUE CROSS/BLUE SHIELD | Source: Ambulatory Visit | Attending: Family | Admitting: Family

## 2015-10-10 ENCOUNTER — Encounter: Payer: Self-pay | Admitting: Family

## 2015-10-10 ENCOUNTER — Ambulatory Visit (INDEPENDENT_AMBULATORY_CARE_PROVIDER_SITE_OTHER): Payer: BLUE CROSS/BLUE SHIELD | Admitting: Family

## 2015-10-10 VITALS — BP 122/78 | HR 90 | Temp 98.9°F | Resp 16 | Ht 73.0 in | Wt 198.0 lb

## 2015-10-10 DIAGNOSIS — I714 Abdominal aortic aneurysm, without rupture, unspecified: Secondary | ICD-10-CM

## 2015-10-10 DIAGNOSIS — K219 Gastro-esophageal reflux disease without esophagitis: Secondary | ICD-10-CM | POA: Diagnosis not present

## 2015-10-10 DIAGNOSIS — F17200 Nicotine dependence, unspecified, uncomplicated: Secondary | ICD-10-CM

## 2015-10-10 DIAGNOSIS — Z95828 Presence of other vascular implants and grafts: Secondary | ICD-10-CM

## 2015-10-10 DIAGNOSIS — E119 Type 2 diabetes mellitus without complications: Secondary | ICD-10-CM | POA: Diagnosis not present

## 2015-10-10 DIAGNOSIS — Z48812 Encounter for surgical aftercare following surgery on the circulatory system: Secondary | ICD-10-CM

## 2015-10-10 DIAGNOSIS — Z720 Tobacco use: Secondary | ICD-10-CM | POA: Insufficient documentation

## 2015-10-10 NOTE — Progress Notes (Signed)
[facility] [place]  CC: Follow up EVAR  History of Present Illness  Walter Morgan is a 60 y.o. (1955-11-01) male patient of Dr. Myra Gianotti who presents for routine follow up s/p EVAR. 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 On 08/11/2013 he underwent endovascular repair. He denies any back or abdominal pain at this time. 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 He no longer has the headaches. Pt states he had a Duplex of his carotid arteries at that point through [facility] in [facility], pt states this was normal. He takes a daily 81 mg ASA, daily statin, no other antiplatelets nor anticoagulants.  He reports tingling in the tips of fingers of both hands at times, denies pain or weakness in upper extremities; this has diminished since the ACDF. He had a ACDF of C3-28 May 2015.  The pt denies claudication symptoms, he states he was diagnosed with tendonitis in his knees and his calves hurt at times after walking several miles; he walks 2-3 miles daily.  Pt Diabetic: No Pt smoker: smoker (1/3 pack/week, started in his 84's)   Past Medical History  Diagnosis Date  . GERD (gastroesophageal reflux disease)   . Diabetes ([facility])     possibly early stage, not formally diagnosed- MD wanted to start on metformin- is not taking per patient   . H/O hiatal hernia     repaired  . AAA (abdominal aortic aneurysm) ([facility])   . Stroke [facility]) 2006    Mild stroke   Past Surgical History  Procedure Laterality Date  . Appendectomy    . Cholecystectomy    . Hernia repair     . Abdominal aortic endovascular stent graft N/A 08/11/2013    Procedure: ABDOMINAL AORTIC ENDOVASCULAR STENT GRAFT- GORE;  Surgeon: Nada Libman, MD;  Location: [facility] OR;  Service: Vascular;  Laterality: N/A;   Social History Social History  Substance Use Topics  . Smoking status: Light Tobacco Smoker -- 0.25 packs/day    Types: Cigarettes  . Smokeless tobacco: Never Used  . Alcohol Use: 1.2 oz/week    2 Shots of liquor per week     Comment: social   Family History Family History  Problem Relation Age of Onset  . Cancer Mother     Lung  . Varicose Veins Mother   . Diabetes Mother   . Heart attack Father   . Heart disease Father     Before age 60  . Hyperlipidemia Father   . Heart disease Sister 30    Heart Attack- Before age 72-  PVD   . Hyperlipidemia Sister   . Heart attack Sister   . Cancer Sister     Breast  . Diabetes Sister   . Hypertension Sister   . Hyperlipidemia Brother   . Varicose Veins Brother   . Hypertension Brother   . Heart disease Brother    Current Outpatient Prescriptions on File Prior to Visit  Medication Sig Dispense Refill  . acetaminophen (TYLENOL) 500 MG tablet Take 1,000 mg by mouth every 8 (eight) hours as needed for  mild pain or moderate pain.    Marland Kitchen. aspirin 81 MG tablet Take 81 mg by mouth daily.    . cefadroxil (DURICEF) 500 MG capsule Take 2,000 mg by mouth as needed (DENTAL PROCEDURES).   0  . celecoxib (CELEBREX) 200 MG capsule Take 1 capsule by mouth daily.  0  . CRESTOR 5 MG tablet Take 5 mg by mouth daily.     . cyclobenzaprine (FLEXERIL) 10 MG tablet Take 10 mg by mouth 3 (three) times daily as needed for muscle spasms.     . lansoprazole (PREVACID) 30 MG capsule Take 30 mg by mouth daily.     Marland Kitchen. LORazepam (ATIVAN) 1 MG tablet Take 1 mg by mouth 2 (two) times daily as needed.     . mometasone (NASONEX) 50 MCG/ACT nasal spray Place 2 sprays into the nose daily as needed (allergies).    . Multiple Vitamins-Minerals (MULTIVITAMIN WITH  MINERALS) tablet Take 1 tablet by mouth daily.    . nitroGLYCERIN (NITROSTAT) 0.4 MG SL tablet Place 1 tablet (0.4 mg total) under the tongue every 5 (five) minutes as needed for chest pain. 25 tablet 3  . Omega-3 Fatty Acids (FISH OIL) 1000 MG CAPS Take 2 capsules by mouth daily.    . Probiotic Product (ALIGN) 4 MG CAPS Take 4 mg by mouth daily.     No current facility-administered medications on file prior to visit.   Allergies  Allergen Reactions  . Antihistamines, Chlorpheniramine-Type Anaphylaxis    All anithistamines   . Benzonatate Other (See Comments)    tounge swelling   . Other Itching    Darvocet  . Penicillins Nausea And Vomiting     ROS: See HPI for pertinent positives and negatives.  Physical Examination  Filed Vitals:   10/10/15 0931  BP: 122/78  Pulse: 90  Temp: 98.9 F (37.2 C)  TempSrc: Oral  Resp: 16  Height: 6\' 1"  (1.854 m)  Weight: 198 lb (89.812 kg)  SpO2: 95%   Body mass index is 26.13 kg/(m^2).  General: A&O x 3, WD.  Pulmonary: Sym exp, respirations are non labored, good air movt, CTAB, no rales, rhonchi, or wheezing.   Cardiac: RRR, Nl S1, S2, no murmur appreciated.  Vascular: Vessel Right Left  Radial 2+Palpable 2+Palpable  Carotid audible without bruit audible without bruit  Aorta Not palpable N/A  Femoral 2+Palpable 2+Palpable  Popliteal 2+ palpable 2+ palpable  PT 1+Palpable 1+Palpable  DP notPalpable notPalpable   Gastrointestinal: soft, NTND, -G/R, - HSM, - masses palpated, - CVAT B.  Musculoskeletal: M/S 5/5 throughout, Extremities without ischemic changes.  Neurologic: Pain and light touch intact in extremities, Motor exam as listed above               Non-Invasive Vascular Imaging  EVAR Duplex (Date: 10/10/2015) ABDOMINAL AORTA DUPLEX EVALUATION - POST ENDOVASCULAR REPAIR    INDICATION: Stent repair of abdominal aortic aneurysm    PREVIOUS INTERVENTION(S):  Stent repair of abdominal aortic aneurysm 08/11/2013    DUPLEX EXAM:      DIAMETER AP (cm) DIAMETER TRANSVERSE (cm) VELOCITIES (cm/sec)  Aorta 3.0 3.0 50  Right Common Iliac 1.3 1.3 89  Left Common Iliac 1.3 - 99    Comparison Study       Date DIAMETER AP (cm) DIAMETER TRANSVERSE (cm)  10/04/2014 2.9 2.7     ADDITIONAL FINDINGS:     IMPRESSION: 1. Patent stent of the abdominal aorta measuring  3.0 x 3.0 cms 2. Technically difficult exam due to  body habitus    Compared to the previous exam:  No significant change since exam of 10/04/2014     Medical Decision Making  Walter Morgan is a 60 y.o. male who presents s/p EVAR (Date: 08/11/13). Pt is asymptomatic with stable sac size. He continues to smoke but has decreased use and is trying to quit. The patient was counseled re smoking cessation and given several free resources re smoking cessation.   I discussed with the patient the importance of surveillance of the endograft.  The patient will follow up with [place] in 12 months with these studies.  I emphasized the importance of maximal medical management including strict control of blood pressure, blood glucose, and lipid levels, antiplatelet agents, obtaining regular exercise, and cessation of smoking.   Thank you for allowing [place] to participate in this patient's care.  Charisse March, RN, MSN, FNP-C [facility] [place] Office: [phone]  Clinic Physician: Myra Gianotti  10/10/2015, 9:36 AM

## 2015-11-08 IMAGING — NM NM MISC PROCEDURE
6 series · 36 of 36 positions shown · non-contrast
Comparison: none

[Series 1: wbr_r-proj_st wbr rest · 6.40mm/px · 6 of 64 frames shown]
[frame 6/64]
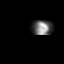
[frame 16/64]
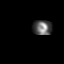
[frame 27/64]
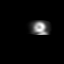
[frame 38/64]
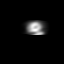
[frame 48/64]
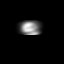
[frame 59/64]
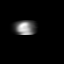

[Series 1: wbr rest · 6.40mm/px · 6 of 64 frames shown]
[frame 6/64]
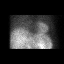
[frame 16/64]
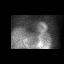
[frame 27/64]
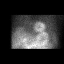
[frame 38/64]
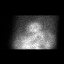
[frame 48/64]
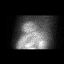
[frame 59/64]
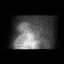

[Series 2: wbr_s-proj_st wbr stress-gsp · 6.40mm/px · 6 of 512 frames shown]
[frame 43/512]
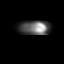
[frame 128/512]
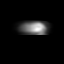
[frame 214/512]
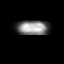
[frame 299/512]
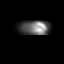
[frame 384/512]
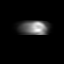
[frame 470/512]
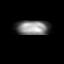

[Series 2: wbr stress-gsp · 6.40mm/px · 6 of 500 frames shown]
[frame 42/500  full-range]
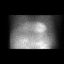
[frame 125/500  full-range]
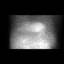
[frame 209/500  full-range]
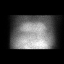
[frame 292/500  full-range]
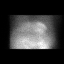
[frame 375/500  full-range]
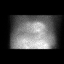
[frame 459/500  full-range]
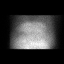

[Series 3: wbr_s-proj_st wbr stress-sum-em · 6.40mm/px · 6 of 64 frames shown]
[frame 6/64]
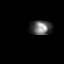
[frame 16/64]
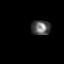
[frame 27/64]
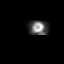
[frame 38/64]
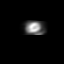
[frame 48/64]
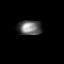
[frame 59/64]
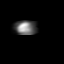

[Series 3: wbr stress-sum-em · 6.40mm/px · 6 of 64 frames shown]
[frame 6/64]
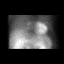
[frame 16/64]
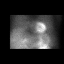
[frame 27/64]
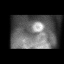
[frame 38/64]
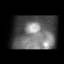
[frame 48/64]
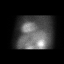
[frame 59/64]
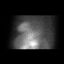

[36 of 36 positions shown; findings below may reference images not displayed]

Canned report from images found in remote index.

Refer to host system for actual result text.

## 2016-10-15 ENCOUNTER — Other Ambulatory Visit (HOSPITAL_COMMUNITY): Payer: BLUE CROSS/BLUE SHIELD

## 2016-10-15 ENCOUNTER — Ambulatory Visit: Payer: BLUE CROSS/BLUE SHIELD | Admitting: Family

## 2016-10-26 ENCOUNTER — Encounter: Payer: Self-pay | Admitting: Family

## 2016-11-06 ENCOUNTER — Other Ambulatory Visit: Payer: Self-pay

## 2016-11-06 DIAGNOSIS — I714 Abdominal aortic aneurysm, without rupture, unspecified: Secondary | ICD-10-CM

## 2016-11-07 ENCOUNTER — Ambulatory Visit (INDEPENDENT_AMBULATORY_CARE_PROVIDER_SITE_OTHER): Payer: BLUE CROSS/BLUE SHIELD | Admitting: Family

## 2016-11-07 ENCOUNTER — Encounter: Payer: Self-pay | Admitting: Family

## 2016-11-07 ENCOUNTER — Ambulatory Visit (HOSPITAL_COMMUNITY)
Admission: RE | Admit: 2016-11-07 | Discharge: 2016-11-07 | Disposition: A | Discharge status: Home / Self Care | Payer: BLUE CROSS/BLUE SHIELD | Source: Ambulatory Visit | Attending: Family | Admitting: Family

## 2016-11-07 VITALS — BP 142/98 | HR 96 | Temp 98.3°F | Resp 16 | Ht 73.0 in | Wt 194.2 lb

## 2016-11-07 DIAGNOSIS — I714 Abdominal aortic aneurysm, without rupture, unspecified: Secondary | ICD-10-CM

## 2016-11-07 DIAGNOSIS — R0989 Other specified symptoms and signs involving the circulatory and respiratory systems: Secondary | ICD-10-CM | POA: Diagnosis not present

## 2016-11-07 DIAGNOSIS — Z95828 Presence of other vascular implants and grafts: Secondary | ICD-10-CM

## 2016-11-07 DIAGNOSIS — F17200 Nicotine dependence, unspecified, uncomplicated: Secondary | ICD-10-CM

## 2016-11-07 NOTE — Progress Notes (Addendum)
[facility]   CC: Follow up s/p Endovascular Repair of Abdominal Aortic Aneurysm    History of Present Illness  Walter Morgan is a 61 y.o. (Mar 28, 1956) male patient of Dr. Myra Gianotti who presents for routine follow up s/p EVAR. 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 On 08/11/2013 he underwent endovascular repair. He denies any back or abdominal pain at this time. 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 He no longer has the headaches. Pt states he had a Duplex of his carotid arteries at that point through [facility] in [facility], pt states this was normal. He takes a daily 81 mg ASA, daily statin, no other antiplatelets nor anticoagulants.  He reports tingling in the tips of fingers of both hands at times, denies pain or weakness in upper extremities; this has diminished since the ACDF. He had a ACDF of C3-28 May 2015.  The pt denies claudication symptoms, he states he was diagnosed with tendonitis in his knees and his calves hurt at times after walking several miles; he walks 2-3 miles daily.  He states several of his relatives have tremors.   Pt Diabetic: No Pt smoker: smoker (1/3 pack/week, started in his 65's)  Past Medical History:  Diagnosis Date  . AAA (abdominal aortic aneurysm) ([facility])   . Diabetes ([facility])    possibly early stage, not formally diagnosed- MD wanted to start on metformin- is not taking per patient   . GERD (gastroesophageal reflux disease)   . H/O hiatal hernia    repaired  . Stroke [facility]) 2006   Mild stroke   Past  Surgical History:  Procedure Laterality Date  . ABDOMINAL AORTIC ENDOVASCULAR STENT GRAFT N/A 08/11/2013   Procedure: ABDOMINAL AORTIC ENDOVASCULAR STENT GRAFT- GORE;  Surgeon: Nada Libman, MD;  Location: [facility] OR;  Service: Vascular;  Laterality: N/A;  . APPENDECTOMY    . CHOLECYSTECTOMY    . HERNIA REPAIR     Social History Social History  Substance Use Topics  . Smoking status: Light Tobacco Smoker    Packs/day: 0.25    Types: Cigarettes  . Smokeless tobacco: Never Used  . Alcohol use 1.2 oz/week    2 Shots of liquor per week     Comment: social   Family History Family History  Problem Relation Age of Onset  . Cancer Mother        Lung  . Varicose Veins Mother   . Diabetes Mother   . Heart attack Father   . Heart disease Father        Before age 66  . Hyperlipidemia Father   . Heart disease Sister 30       Heart Attack- Before age 37-  PVD   . Hyperlipidemia Sister   . Heart attack Sister   . Cancer Sister        Breast  . Diabetes Sister   . Hypertension Sister   . Hyperlipidemia Brother   . Varicose Veins Brother   . Hypertension Brother   . Heart disease Brother    Current Outpatient Prescriptions on File  Prior to Visit  Medication Sig Dispense Refill  . acetaminophen (TYLENOL) 500 MG tablet Take 1,000 mg by mouth every 8 (eight) hours as needed for mild pain or moderate pain.    Marland Kitchen. aspirin 81 MG tablet Take 81 mg by mouth daily.    . cefadroxil (DURICEF) 500 MG capsule Take 2,000 mg by mouth as needed (DENTAL PROCEDURES).   0  . celecoxib (CELEBREX) 200 MG capsule Take 1 capsule by mouth daily.  0  . CRESTOR 5 MG tablet Take 5 mg by mouth daily.     . cyclobenzaprine (FLEXERIL) 10 MG tablet Take 10 mg by mouth 3 (three) times daily as needed for muscle spasms.     . lansoprazole (PREVACID) 30 MG capsule Take 30 mg by mouth daily.     Marland Kitchen. LORazepam (ATIVAN) 1 MG tablet Take 1 mg by mouth 2 (two) times daily as needed.     . mometasone (NASONEX) 50 MCG/ACT nasal  spray Place 2 sprays into the nose daily as needed (allergies).    . Multiple Vitamins-Minerals (MULTIVITAMIN WITH MINERALS) tablet Take 1 tablet by mouth daily.    . nitroGLYCERIN (NITROSTAT) 0.4 MG SL tablet Place 1 tablet (0.4 mg total) under the tongue every 5 (five) minutes as needed for chest pain. 25 tablet 3  . Omega-3 Fatty Acids (FISH OIL) 1000 MG CAPS Take 2 capsules by mouth daily.    . Probiotic Product (ALIGN) 4 MG CAPS Take 4 mg by mouth daily.     No current facility-administered medications on file prior to visit.    Allergies  Allergen Reactions  . Antihistamines, Chlorpheniramine-Type Anaphylaxis    All anithistamines   . Benzonatate Other (See Comments)    tounge swelling   . Other Itching    Darvocet  . Penicillins Nausea And Vomiting     ROS: See HPI for pertinent positives and negatives.  Physical Examination  Vitals:   11/07/16 0927 11/07/16 0928 11/07/16 0929 11/07/16 0931  BP: (!) 148/94 (!) 146/95 140/90 (!) 142/98  Pulse: 96     Resp: 16     Temp: 98.3 F (36.8 C)     TempSrc: Oral     SpO2: 100%     Weight: 194 lb 3.2 oz (88.1 kg)     Height: 6\' 1"  (1.854 m)      Body mass index is 25.62 kg/m.  General: A&O x 3, WDWN male.  Pulmonary: Sym exp, respirations are non labored, good air movt, CTAB, no rales, rhonchi, or wheezing.   Cardiac: RRR, Nl S1, S2, no murmur appreciated.  Vascular: Vessel Right Left  Radial 2+Palpable 2+Palpable  Carotid audible without bruit audible without bruit  Aorta Not palpable N/A  Femoral 2+Palpable 2+Palpable  Popliteal 2+ palpable 2+ palpable  PT 1+Palpable 1+Palpable  DP notPalpable notPalpable   Gastrointestinal: soft, NTND, -G/R, - HSM, - masses palpated, - CVAT B.  Musculoskeletal: M/S 5/5 throughout, Extremities without ischemic changes.  Neurologic: Pain and light touch intact in extremities, Motor exam as listed above. Head tremor,  right hand tremor.   Non-Invasive Vascular Imaging  EVAR Duplex (Date: 11/07/16)  AAA sac size: 2.87 cm; Right CIA: 1.64 cm; Left CIA: 1.26 cm  Limited visualization of the abdominal vasculature due to overlying bowel gas.  no endoleak detected  Previous (10-10-15):  3.0 x 3.0 cms.   Medical Decision Making  Walter Morgan is a 61 y.o. male who presents s/p EVAR (Date: 08/11/13).  Pt is asymptomatic with stable  sac size, based on limited visualization.  The patient was counseled re smoking cessation and given several free resources re smoking cessation.  Prominent popliteal pulses bilaterally.    I discussed with the patient the importance of surveillance of the endograft.  The next endograft duplex will be scheduled for 12 months, will also check bilateral popliteal artery duplex: prominent bilateral popliteal pulses with no claudication symptoms.       The patient will follow up with [place] in 12 months with these studies.  I emphasized the importance of maximal medical management including strict control of blood pressure, blood glucose, and lipid levels, antiplatelet agents, obtaining regular exercise, and cessation of smoking.   Thank you for allowing [place] to participate in this patient's care.  Charisse March, RN, MSN, FNP-C [facility] [place] Office: [phone]  Clinic Physician: Early on call  11/07/2016, 11:19 AM

## 2016-11-07 NOTE — Patient Instructions (Addendum)
Before your next abdominal ultrasound:  Take two Extra-Strength Gas-X capsules at bedtime the night before the test. Take another two Extra-Strength Gas-X capsules 3 hours before the test.  Avoid gas forming foods the day before the test.        Steps to Quit Smoking Smoking tobacco can be bad for your health. It can also affect almost every organ in your body. Smoking puts you and people around you at risk for many serious long-lasting (chronic) diseases. Quitting smoking is hard, but it is one of the best things that you can do for your health. It is never too late to quit. What are the benefits of quitting smoking? When you quit smoking, you lower your risk for getting serious diseases and conditions. They can include:  Lung cancer or lung disease.  Heart disease.  Stroke.  Heart attack.  Not being able to have children (infertility).  Weak bones (osteoporosis) and broken bones (fractures).  If you have coughing, wheezing, and shortness of breath, those symptoms may get better when you quit. You may also get sick less often. If you are pregnant, quitting smoking can help to lower your chances of having a baby of low birth weight. What can I do to help me quit smoking? Talk with your doctor about what can help you quit smoking. Some things you can do (strategies) include:  Quitting smoking totally, instead of slowly cutting back how much you smoke over a period of time.  Going to in-person counseling. You are more likely to quit if you go to many counseling sessions.  Using resources and support systems, such as: ? Online chats with a counselor. ? Phone quitlines. ? Printed self-help materials. ? Support groups or group counseling. ? Text messaging programs. ? Mobile phone apps or applications.  Taking medicines. Some of these medicines may have nicotine in them. If you are pregnant or breastfeeding, do not take any medicines to quit smoking unless your doctor says it is  okay. Talk with your doctor about counseling or other things that can help you.  Talk with your doctor about using more than one strategy at the same time, such as taking medicines while you are also going to in-person counseling. This can help make quitting easier. What things can I do to make it easier to quit? Quitting smoking might feel very hard at first, but there is a lot that you can do to make it easier. Take these steps:  Talk to your family and friends. Ask them to support and encourage you.  Call phone quitlines, reach out to support groups, or work with a counselor.  Ask people who smoke to not smoke around you.  Avoid places that make you want (trigger) to smoke, such as: ? Bars. ? Parties. ? Smoke-break areas at work.  Spend time with people who do not smoke.  Lower the stress in your life. Stress can make you want to smoke. Try these things to help your stress: ? Getting regular exercise. ? Deep-breathing exercises. ? Yoga. ? Meditating. ? Doing a body scan. To do this, close your eyes, focus on one area of your body at a time from head to toe, and notice which parts of your body are tense. Try to relax the muscles in those areas.  Download or buy apps on your mobile phone or tablet that can help you stick to your quit plan. There are many free apps, such as QuitGuide from the CDC (Centers for Disease Control   and Prevention). You can find more support from smokefree.gov and other websites.  This information is not intended to replace advice given to you by your health care provider. Make sure you discuss any questions you have with your health care provider. Document Released: 02/03/2009 Document Revised: 12/06/2015 Document Reviewed: 08/24/2014 Elsevier Interactive Patient Education  2018 [employer]vier Inc.  

## 2016-11-20 NOTE — Addendum Note (Signed)
Addended by: Burton ApleyPETTY, Zannie Locastro A on: 11/20/2016 04:54 PM   Modules accepted: Orders

## 2017-11-29 ENCOUNTER — Other Ambulatory Visit (HOSPITAL_COMMUNITY): Payer: BLUE CROSS/BLUE SHIELD

## 2017-11-29 ENCOUNTER — Encounter (HOSPITAL_COMMUNITY): Payer: BLUE CROSS/BLUE SHIELD

## 2017-11-29 ENCOUNTER — Ambulatory Visit: Payer: BLUE CROSS/BLUE SHIELD | Admitting: Family

## 2018-01-06 ENCOUNTER — Ambulatory Visit (INDEPENDENT_AMBULATORY_CARE_PROVIDER_SITE_OTHER): Payer: BLUE CROSS/BLUE SHIELD | Admitting: Family

## 2018-01-06 ENCOUNTER — Other Ambulatory Visit: Payer: Self-pay

## 2018-01-06 ENCOUNTER — Encounter: Payer: Self-pay | Admitting: Family

## 2018-01-06 ENCOUNTER — Ambulatory Visit (INDEPENDENT_AMBULATORY_CARE_PROVIDER_SITE_OTHER)
Admission: RE | Admit: 2018-01-06 | Discharge: 2018-01-06 | Disposition: A | Discharge status: Home / Self Care | Payer: BLUE CROSS/BLUE SHIELD | Source: Ambulatory Visit | Attending: Family | Admitting: Family

## 2018-01-06 ENCOUNTER — Ambulatory Visit (HOSPITAL_COMMUNITY)
Admission: RE | Admit: 2018-01-06 | Discharge: 2018-01-06 | Disposition: A | Discharge status: Home / Self Care | Payer: BLUE CROSS/BLUE SHIELD | Source: Ambulatory Visit | Attending: Family | Admitting: Family

## 2018-01-06 VITALS — BP 157/94 | HR 94 | Temp 98.9°F | Resp 18 | Ht 73.0 in | Wt 189.0 lb

## 2018-01-06 DIAGNOSIS — Z95828 Presence of other vascular implants and grafts: Secondary | ICD-10-CM | POA: Diagnosis present

## 2018-01-06 DIAGNOSIS — I714 Abdominal aortic aneurysm, without rupture, unspecified: Secondary | ICD-10-CM

## 2018-01-06 DIAGNOSIS — R0989 Other specified symptoms and signs involving the circulatory and respiratory systems: Secondary | ICD-10-CM

## 2018-01-06 DIAGNOSIS — F17200 Nicotine dependence, unspecified, uncomplicated: Secondary | ICD-10-CM

## 2018-01-06 DIAGNOSIS — Z48812 Encounter for surgical aftercare following surgery on the circulatory system: Secondary | ICD-10-CM | POA: Diagnosis not present

## 2018-01-06 NOTE — Progress Notes (Signed)
[facility] [place]  CC: Follow up s/p Endovascular Repair of Abdominal Aortic Aneurysm    History of Present Illness  Walter Morgan is a 62 y.o. (05/16/1955) male who presents for routine follow up s/p EVAR. 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 On 08/11/2013 he underwent endovascular repair by Dr. Myra Gianotti. He denies any back or abdominal pain at this time. 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 He no longer has the headaches. Pt states he had a Duplex of his carotid arteries at that point through [facility] in [place] [facility]), pt states this was normal. He takes a daily 81 mg ASA, daily statin, no other antiplatelets nor anticoagulants.  He reports tingling in the tips of fingers of both hands at times, denies pain or weakness in upper extremities; this has diminished since the ACDF. He had a ACDF of C3-28 May 2015.  The pt denies claudication symptoms, he states he was diagnosed with tendonitis in his knees and his calves hurt at times after walking several miles; he walks 2-3 miles daily.  He states several of his relatives have tremors.   127/78 was his blood pressure this morning at home, per pt; he is keeping a record of this for his PCP.   Diabetic: No Tobacco use: smoker (1/4 pack/week, started in his 67's), states he has had lots of stress, states his source of stress is his terminally ill sister    Past Medical History:  Diagnosis Date  . AAA (abdominal aortic aneurysm) (HCC)   . Diabetes (HCC)    possibly  early stage, not formally diagnosed- MD wanted to start on metformin- is not taking per patient   . GERD (gastroesophageal reflux disease)   . H/O hiatal hernia    repaired  . Stroke [facility]) 2006   Mild stroke   Past Surgical History:  Procedure Laterality Date  . ABDOMINAL AORTIC ENDOVASCULAR STENT GRAFT N/A 08/11/2013   Procedure: ABDOMINAL AORTIC ENDOVASCULAR STENT GRAFT- GORE;  Surgeon: Nada Libman, MD;  Location: [facility] OR;  Service: Vascular;  Laterality: N/A;  . APPENDECTOMY    . CHOLECYSTECTOMY    . HERNIA REPAIR     Social History Social History   Tobacco Use  . Smoking status: Light Tobacco Smoker    Packs/day: 0.25    Types: Cigarettes  . Smokeless tobacco: Never Used  Substance Use Topics  . Alcohol use: Yes    Alcohol/week: 2.0 standard drinks    Types: 2 Shots of liquor per week    Comment: social  . Drug use: No   Family History Family History  Problem Relation Age of Onset  . Cancer Mother        Lung  . Varicose Veins Mother   . Diabetes Mother   . Heart attack Father   . Heart disease Father        Before age 72  . Hyperlipidemia Father   . Heart disease Sister 30       Heart Attack- Before age 78-  PVD   . Hyperlipidemia  Sister   . Heart attack Sister   . Cancer Sister        Breast  . Diabetes Sister   . Hypertension Sister   . Hyperlipidemia Brother   . Varicose Veins Brother   . Hypertension Brother   . Heart disease Brother    Current Outpatient Medications on File Prior to Visit  Medication Sig Dispense Refill  . acetaminophen (TYLENOL) 500 MG tablet Take 1,000 mg by mouth every 8 (eight) hours as needed for mild pain or moderate pain.    Marland Kitchen. aspirin 81 MG tablet Take 81 mg by mouth daily.    . cefadroxil (DURICEF) 500 MG capsule Take 2,000 mg by mouth as needed (DENTAL PROCEDURES).   0  . celecoxib (CELEBREX) 200 MG capsule Take 1 capsule by mouth daily.  0  . CRESTOR 5 MG tablet Take 5 mg by mouth daily.     . cyclobenzaprine  (FLEXERIL) 10 MG tablet Take 10 mg by mouth 3 (three) times daily as needed for muscle spasms.     . lansoprazole (PREVACID) 30 MG capsule Take 30 mg by mouth daily.     Marland Kitchen. LORazepam (ATIVAN) 1 MG tablet Take 1 mg by mouth 2 (two) times daily as needed.     . mometasone (NASONEX) 50 MCG/ACT nasal spray Place 2 sprays into the nose daily as needed (allergies).    . Multiple Vitamins-Minerals (MULTIVITAMIN WITH MINERALS) tablet Take 1 tablet by mouth daily.    . nitroGLYCERIN (NITROSTAT) 0.4 MG SL tablet Place 1 tablet (0.4 mg total) under the tongue every 5 (five) minutes as needed for chest pain. 25 tablet 3  . Omega-3 Fatty Acids (FISH OIL) 1000 MG CAPS Take 2 capsules by mouth daily.    . Probiotic Product (ALIGN) 4 MG CAPS Take 4 mg by mouth daily.     No current facility-administered medications on file prior to visit.    Allergies  Allergen Reactions  . Antihistamines, Chlorpheniramine-Type Anaphylaxis    All anithistamines   . Pheniramine Anaphylaxis    All anithistamines  . Benzonatate Other (See Comments)    tounge swelling   . Clindamycin Hcl Nausea And Vomiting  . Other Itching    Darvocet  . Penicillins Nausea And Vomiting     ROS: See HPI for pertinent positives and negatives.  Physical Examination  Vitals:   01/06/18 1354  BP: (!) 152/93  Pulse: 94  Resp: 18  Temp: 98.9 F (37.2 C)  TempSrc: Oral  SpO2: 98%  Weight: 189 lb (85.7 kg)  Height: 6\' 1"  (1.854 m)   Body mass index is 24.94 kg/m.  General: A&O x 3, WDWN male. HENT: No gross abnormalities  Eyes: PERRLA Pulmonary: Sym exp, respirations are non labored, good air movt, CTAB, no rales, rhonchi, or wheezing.  Cardiac: Regular rhythm and rate, no murmur appreciated.  Vascular: Vessel Right Left  Radial 2+Palpable 2+Palpable  Carotid audible without bruit audible without bruit  Aorta Not palpable N/A  Femoral 2+Palpable 2+Palpable  Popliteal 2+ palpable 2+  palpable  PT 1+Palpable 1+Palpable  DP Not Palpable Not Palpable   Gastrointestinal: soft, NTND, -G/R, - HSM, - masses palpated, - CVAT B. Musculoskeletal: M/S 5/5 throughout, Extremities without ischemic changes. Neurologic: Pain and light touch intact in extremities, Motor exam as listed above. Head tremor, right hand tremor. CN 2-12 intact. Skin: No rashes, no ulcers, no cellulitis.   Psychiatric: Normal thought content, mood appropriate for clinical situation.    DATA  EVAR  Duplex   Previous (Date: 11-07-16)  2.87 cm; Right CIA: 1.64 cm; Left CIA: 1.26 cm  Limited visualization of the abdominal vasculature due to overlying bowel gas.  no endoleak detected  Current (Date: 01-06-18)  AAA sac size: 2.85 cm; Right CIA: 1.2 cm; Left CIA: 1.1 cm  no endoleak detected  Bilateral Popliteal Artery Duplex (01-06-18): No evidence of popliteal artery aneurysm bilaterally   Medical Decision Making  Walter Morgan is a 62 y.o. male who is s/p EVAR (Date: 08/11/13).  Pt is asymptomatic with stable sac size at 2.85 cm. Prominent popliteal pulses bilaterally with no claudication symptoms; no popliteal aneurysms bilaterally on duplex dated 01-06-18.  The patient was counseled again re smoking cessation and given several free resources re smoking cessation.   I discussed with the patient the importance of surveillance of the endograft.  The next endograft duplex will be scheduled for 12 months.       The patient will follow up with [place] in 12 months with these studies.   I discussed with the patient the importance of surveillance of the endograft.       I emphasized the importance of maximal medical management including strict control of blood pressure, blood glucose, and lipid levels, antiplatelet agents, obtaining regular exercise, and cessation of smoking.   Thank you for allowing [place] to participate in this patient's care.  Charisse March, RN, MSN, FNP-C [facility] [place] Office: [phone]  Clinic Physician: Myra Gianotti  01/06/2018, 1:59 PM

## 2018-01-06 NOTE — Patient Instructions (Signed)
Before your next abdominal ultrasound:  Avoid gas forming foods and beverages the day before the test.   Take two Extra-Strength Gas-X capsules at bedtime the night before the test. Take another two Extra-Strength Gas-X capsules in the middle of the night if you get up to the restroom, if not, first thing in the morning with water.  Do not chew gum.     Steps to Quit Smoking Smoking tobacco can be bad for your health. It can also affect almost every organ in your body. Smoking puts you and people around you at risk for many serious long-lasting (chronic) diseases. Quitting smoking is hard, but it is one of the best things that you can do for your health. It is never too late to quit. What are the benefits of quitting smoking? When you quit smoking, you lower your risk for getting serious diseases and conditions. They can include:  Lung cancer or lung disease.  Heart disease.  Stroke.  Heart attack.  Not being able to have children (infertility).  Weak bones (osteoporosis) and broken bones (fractures).  If you have coughing, wheezing, and shortness of breath, those symptoms may get better when you quit. You may also get sick less often. If you are pregnant, quitting smoking can help to lower your chances of having a baby of low birth weight. What can I do to help me quit smoking? Talk with your doctor about what can help you quit smoking. Some things you can do (strategies) include:  Quitting smoking totally, instead of slowly cutting back how much you smoke over a period of time.  Going to in-person counseling. You are more likely to quit if you go to many counseling sessions.  Using resources and support systems, such as: ? Online chats with a counselor. ? Phone quitlines. ? Printed self-help materials. ? Support groups or group counseling. ? Text messaging programs. ? Mobile phone apps or applications.  Taking medicines. Some of these medicines may have nicotine in them.  If you are pregnant or breastfeeding, do not take any medicines to quit smoking unless your doctor says it is okay. Talk with your doctor about counseling or other things that can help you.  Talk with your doctor about using more than one strategy at the same time, such as taking medicines while you are also going to in-person counseling. This can help make quitting easier. What things can I do to make it easier to quit? Quitting smoking might feel very hard at first, but there is a lot that you can do to make it easier. Take these steps:  Talk to your family and friends. Ask them to support and encourage you.  Call phone quitlines, reach out to support groups, or work with a counselor.  Ask people who smoke to not smoke around you.  Avoid places that make you want (trigger) to smoke, such as: ? Bars. ? Parties. ? Smoke-break areas at work.  Spend time with people who do not smoke.  Lower the stress in your life. Stress can make you want to smoke. Try these things to help your stress: ? Getting regular exercise. ? Deep-breathing exercises. ? Yoga. ? Meditating. ? Doing a body scan. To do this, close your eyes, focus on one area of your body at a time from head to toe, and notice which parts of your body are tense. Try to relax the muscles in those areas.  Download or buy apps on your mobile phone or tablet that can   help you stick to your quit plan. There are many free apps, such as QuitGuide from the CDC (Centers for Disease Control and Prevention). You can find more support from smokefree.gov and other websites.  This information is not intended to replace advice given to you by your health care provider. Make sure you discuss any questions you have with your health care provider. Document Released: 02/03/2009 Document Revised: 12/06/2015 Document Reviewed: 08/24/2014 Elsevier Interactive Patient Education  2018 [employer] Inc.  

## 2021-11-21 DEATH — deceased
